# Patient Record
Sex: Female | Born: 1980 | Hispanic: No | Marital: Married | Smoking: Never smoker
Health system: Southern US, Community
[De-identification: ages and names within clinical notes are randomized; demographics above are authoritative.]

## PROBLEM LIST (undated history)

## (undated) DIAGNOSIS — K8000 Calculus of gallbladder with acute cholecystitis without obstruction: Secondary | ICD-10-CM

## (undated) DIAGNOSIS — D691 Qualitative platelet defects: Secondary | ICD-10-CM

## (undated) HISTORY — DX: Calculus of gallbladder with acute cholecystitis without obstruction: K80.00

## (undated) HISTORY — DX: Qualitative platelet defects: D69.1

---

## 2008-07-15 DIAGNOSIS — A539 Syphilis, unspecified: Secondary | ICD-10-CM

## 2008-07-15 HISTORY — DX: Syphilis, unspecified: A53.9

## 2013-06-18 DIAGNOSIS — D691 Qualitative platelet defects: Secondary | ICD-10-CM

## 2017-07-15 HISTORY — PX: CHOLECYSTECTOMY: SHX55

## 2017-12-11 DIAGNOSIS — K8012 Calculus of gallbladder with acute and chronic cholecystitis without obstruction: Secondary | ICD-10-CM | POA: Insufficient documentation

## 2019-07-16 NOTE — L&D Delivery Note (Signed)
Delivery Note At 11:57 PM a viable and healthy female was delivered via Vaginal, Spontaneous (Presentation: Left Occiput Anterior).  APGAR: 8, 9; weight  .   Placenta status: Spontaneous, Intact.  Cord: 3 vessels with the following complications: Mild Shoulder Dystocia  Shoulder dystocia lasted about 45 seconds.  We used McRoberts maneuver, and I was able to grasp the posterior axilla. We rotated the shoulders 180 degrees then she rotated herself back and I was able to use my hand on her back to get shoulder to clear the symphysis.   Cord pH: Pending  Anesthesia: None Episiotomy: None Lacerations: None   I did note there is a button hold flap of tissue on posterior vaginal wall, likely from old delivery.  It did not rupture, left intact.  No lacerations seen Suture Repair: None Est. Blood Loss (mL): 150  Mom to postpartum.  Baby to Couplet care / Skin to Skin.  Claudia Patterson 05/10/2020, 12:26 AM  Please schedule this patient for Postpartum visit in: 4 weeks with the following provider: Any provider EIther virtual or inperson, needs interpretor For C/S patients schedule nurse incision check in weeks 2 weeks: no Low risk pregnancy complicated by: none Delivery mode:  SVD Anticipated Birth Control:  other/unsure PP Procedures needed: none

## 2019-10-13 LAB — OB RESULTS CONSOLE GC/CHLAMYDIA
Chlamydia: NEGATIVE
Gonorrhea: NEGATIVE

## 2019-10-13 LAB — OB RESULTS CONSOLE RPR: RPR: NONREACTIVE

## 2019-10-13 LAB — OB RESULTS CONSOLE HIV ANTIBODY (ROUTINE TESTING): HIV: NONREACTIVE

## 2019-10-13 LAB — CBC WITH DIFFERENTIAL
HCT: 32
HGB: 11.3 (ref 9.5–13.5)
MCH: 31
MCHC: 35
MCV: 87 (ref 76–111)
MPV: 11.1 fL (ref 0–99.8)
Platelet: 171
RBC: 3.68
RDW: 13.3
WBC: 4.8

## 2019-10-13 LAB — HEMOGLOBIN A1C
Est. average glucose Bld gHb Est-mCnc: 111
Hemoglobin A1C: 5.5

## 2019-10-13 LAB — OB RESULTS CONSOLE ABO/RH: RH Type: POSITIVE

## 2019-10-13 LAB — OB RESULTS CONSOLE ANTIBODY SCREEN: Antibody Screen: NEGATIVE

## 2019-10-13 LAB — OB RESULTS CONSOLE RUBELLA ANTIBODY, IGM: Rubella: IMMUNE

## 2019-10-13 LAB — OB RESULTS CONSOLE PLATELET COUNT: Platelets: 171

## 2019-10-13 LAB — OB RESULTS CONSOLE VARICELLA ZOSTER ANTIBODY, IGG: Varicella: NON-IMMUNE/NOT IMMUNE

## 2019-10-13 LAB — OB RESULTS CONSOLE HGB/HCT, BLOOD
HCT: 32 (ref 29–41)
Hemoglobin: 11.3

## 2019-10-13 LAB — HEPATITIS C ANTIBODY: HCV Ab: NEGATIVE

## 2019-10-13 LAB — OB RESULTS CONSOLE HEPATITIS B SURFACE ANTIGEN: Hepatitis B Surface Ag: NEGATIVE

## 2019-10-14 LAB — CHG HEMOGLOBIN FRACTJ/QUANTJ CHROMOTOGRAPHY
HEMOGLOBIN S: NOT DETECTED
Hemoglobin A - HGBRFX: 97.6
Hemoglobin A2.: 2.4
Hemoglobin F - HGBRFX: NOT DETECTED
Hgb C Quant: NOT DETECTED
Variant Hemoglobin - HGBRFX: NOT DETECTED

## 2020-02-01 ENCOUNTER — Other Ambulatory Visit: Payer: Self-pay

## 2020-02-03 ENCOUNTER — Other Ambulatory Visit: Payer: Self-pay

## 2020-02-03 ENCOUNTER — Ambulatory Visit (INDEPENDENT_AMBULATORY_CARE_PROVIDER_SITE_OTHER): Payer: Medicaid Other | Admitting: Primary Care

## 2020-02-03 ENCOUNTER — Encounter (INDEPENDENT_AMBULATORY_CARE_PROVIDER_SITE_OTHER): Payer: Self-pay | Admitting: Primary Care

## 2020-02-03 VITALS — BP 102/69 | HR 73 | Temp 98.2°F | Ht <= 58 in | Wt 105.4 lb

## 2020-02-03 DIAGNOSIS — Z7689 Persons encountering health services in other specified circumstances: Secondary | ICD-10-CM | POA: Diagnosis not present

## 2020-02-03 NOTE — Progress Notes (Signed)
New Patient Office Visit  Subjective:  Patient ID: Claudia Patterson, female    DOB: 05-08-1981  Age: 39 y.o. MRN: [id number]  CC:  Chief Complaint  Patient presents with  . Establish Care    Pt. is here to establish care. She is pregnant.     HPI Ms. Claudia Patterson presents for establishment of care. She is here with her husband and 6 months pregnant . She is schedule with OBGYN next week.  History reviewed. No pertinent past medical history.  Family History  Problem Relation Age of Onset  . Diabetes Neg Hx     Social History   Socioeconomic History  . Marital status: Married    Spouse name: Not on file  . Number of children: Not on file  . Years of education: Not on file  . Highest education level: Not on file  Occupational History  . Not on file  Tobacco Use  . Smoking status: Never Smoker  . Smokeless tobacco: Never Used  Substance and Sexual Activity  . Alcohol use: Not Currently  . Drug use: Not Currently  . Sexual activity: Not Currently  Other Topics Concern  . Not on file  Social History Narrative  . Not on file   Social Determinants of Health   Financial Resource Strain:   . Difficulty of Paying Living Expenses:   Food Insecurity:   . Worried About Programme researcher, broadcasting/film/video in the Last Year:   . Barista in the Last Year:   Transportation Needs:   . Freight forwarder (Medical):   Marland Kitchen Lack of Transportation (Non-Medical):   Physical Activity:   . Days of Exercise per Week:   . Minutes of Exercise per Session:   Stress:   . Feeling of Stress :   Social Connections:   . Frequency of Communication with Friends and Family:   . Frequency of Social Gatherings with Friends and Family:   . Attends Religious Services:   . Active Member of Clubs or Organizations:   . Attends Banker Meetings:   Marland Kitchen Marital Status:   Intimate Partner Violence:   . Fear of Current or Ex-Partner:   . Emotionally Abused:   Marland Kitchen Physically Abused:   . Sexually  Abused:     ROS Review of Systems  All other systems reviewed and are negative.   Objective:   Today's Vitals: BP 102/69 (BP Location: Left Arm, Patient Position: Sitting, Cuff Size: Normal)   Pulse 73   Temp 98.2 F (36.8 C) (Oral)   Ht 4\' 10"  (1.473 m)   Wt 105 lb 6.4 oz (47.8 kg)   SpO2 99%   BMI 22.03 kg/m   Physical Exam Vitals reviewed.  HENT:     Head: Normocephalic.     Right Ear: Tympanic membrane normal.     Left Ear: Tympanic membrane normal.  Eyes:     Extraocular Movements: Extraocular movements intact.     Pupils: Pupils are equal, round, and reactive to light.  Cardiovascular:     Rate and Rhythm: Normal rate and regular rhythm.  Pulmonary:     Effort: Pulmonary effort is normal.     Breath sounds: Normal breath sounds.  Abdominal:     General: There is distension.  Musculoskeletal:        General: Normal range of motion.     Cervical back: Normal range of motion and neck supple.  Skin:    General: Skin is warm and dry.  Neurological:     Mental Status: She is alert and oriented to person, place, and time.  Psychiatric:        Mood and Affect: Mood normal.        Behavior: Behavior normal.        Thought Content: Thought content normal.        Judgment: Judgment normal.     Assessment & Plan:   [name] was seen today for establish care.  Diagnoses and all orders for this visit:  Encounter to establish care Gwinda Passe, NP-C will be your  (PCP) she is mastered prepared . She is skilled to diagnosed and treat illness. Also able to answer health concern as well as continuing care of varied medical conditions, not limited by cause, organ system, or diagnosis.     No outpatient encounter medications on file as of 02/03/2020.   No facility-administered encounter medications on file as of 02/03/2020.    Follow-up: No follow-ups on file.   Grayce Sessions, NP

## 2020-02-07 ENCOUNTER — Encounter: Payer: Self-pay | Admitting: General Practice

## 2020-02-07 ENCOUNTER — Encounter: Payer: Self-pay | Admitting: Obstetrics and Gynecology

## 2020-02-07 ENCOUNTER — Other Ambulatory Visit: Payer: Self-pay | Admitting: *Deleted

## 2020-02-07 DIAGNOSIS — Z3480 Encounter for supervision of other normal pregnancy, unspecified trimester: Secondary | ICD-10-CM | POA: Insufficient documentation

## 2020-02-10 ENCOUNTER — Other Ambulatory Visit: Payer: Self-pay

## 2020-02-10 ENCOUNTER — Encounter: Payer: Self-pay | Admitting: General Practice

## 2020-02-10 ENCOUNTER — Ambulatory Visit (INDEPENDENT_AMBULATORY_CARE_PROVIDER_SITE_OTHER): Payer: Medicaid Other | Admitting: Obstetrics and Gynecology

## 2020-02-10 VITALS — BP 94/55 | HR 90 | Temp 98.9°F | Wt 106.2 lb

## 2020-02-10 DIAGNOSIS — Z789 Other specified health status: Secondary | ICD-10-CM

## 2020-02-10 DIAGNOSIS — Z3480 Encounter for supervision of other normal pregnancy, unspecified trimester: Secondary | ICD-10-CM

## 2020-02-13 ENCOUNTER — Encounter: Payer: Self-pay | Admitting: Obstetrics and Gynecology

## 2020-02-13 NOTE — Progress Notes (Signed)
INITIAL OBSTETRICAL VISIT  Patient name: Claudia Patterson MRN [id number]  Date of birth: 01-19-1981 Chief Complaint:   Initial Prenatal Visit  History of Present Illness:   Claudia Patterson is a 39 y.o. [id number] [place] female at 108w1d by LMP with an Estimated Date of Delivery: 05/13/20 being seen today for her initial obstetrical visit. She is transferring her care from [place], [place]. Her obstetrical history is significant for multigravida. This is a planned pregnancy. She and the father of the baby (FOB)/spouse live together. She has a support system that consists of her husband/family/friends. Today she reports no complaints.   Patient's last menstrual period was 08/07/2019. Last pap 10/18/2019. Results were: normal Review of Systems:   Pertinent items are noted in HPI Denies cramping/contractions, leakage of fluid, vaginal bleeding, abnormal vaginal discharge w/ itching/odor/irritation, headaches, visual changes, shortness of breath, chest pain, abdominal pain, severe nausea/vomiting, or problems with urination or bowel movements unless otherwise stated above.  Pertinent History Reviewed:  Reviewed past medical,surgical, social, obstetrical and family history.  Reviewed problem list, medications and allergies. OB History  Gravida Para Term Preterm AB Living  5 4 4     4   SAB TAB Ectopic Multiple Live Births          4    # Outcome Date GA Lbr Len/2nd Weight Sex Delivery Anes PTL Lv  5 Current           4 Term 06/18/13    M Vag-Spont   LIV     Complications: Thrombocytasthenia (HCC)  3 Term 11/03/04    M Vag-Spont   LIV  2 Term 09/11/02    F Vag-Spont   LIV  1 Term 10/14/00    F Vag-Spont   LIV   Physical Assessment:   Vitals:   02/10/20 1536  BP: (!) 94/55  Pulse: 90  Temp: 98.9 F (37.2 C)  Weight: 106 lb 3.2 oz (48.2 kg)  Body mass index is 22.2 kg/m.       Physical Examination:  General appearance - well appearing, and in no distress  Mental status - alert, oriented to  person, place, and time  Psych:  She has a normal mood and affect  Skin - warm and dry, normal color, no suspicious lesions noted  Chest - effort normal, all lung fields clear to auscultation bilaterally  Heart - normal rate and regular rhythm  Abdomen - soft, nontender  Extremities:  No swelling or varicosities noted  Pelvic - VULVA: normal appearing vulva with no masses, tenderness or lesions  VAGINA: normal appearing vagina with normal color and discharge, no lesions.   CERVIX: normal appearing cervix without discharge or lesions, no CMT  Thin prep pap is not done    FHTs by doppler: 156 bpm  Assessment & Plan:  1) Low-Risk Pregnancy G5P4004 at [redacted]w[redacted]d with an Estimated Date of Delivery: 05/13/20   2) Initial OB visit - Welcomed to practice and introduced self to patient in addition to discussing other advanced practice providers that she may be seeing at this practice - Congratulated patient - Anticipatory guidance on upcoming appointments - Educated on COVID19 and pregnancy and the integration of virtual appointments  - Educated on babyscripts app- patient reports she has not received email, encouraged to look in spam folder and to call office if she still has not received email - patient verbalizes understanding    3) Supervision of other normal pregnancy, antepartum - Review of limited records in Care Everywhere - 05/15/20  MFM OB COMP + 14 WK; Future   4) Language Barrier Affecting Healthcare - Patient speaks and understands some English  Daughter, Claudia Patterson, used for occasional translation  Meds: No orders of the defined types were placed in this encounter.   Initial labs obtained Continue prenatal vitamins Reviewed n/v relief measures and warning s/s to report Reviewed recommended weight gain based on pre-gravid BMI Encouraged well-balanced diet Genetic Screening discussed: could not find in records -- normal per pt Cystic fibrosis, SMA, Fragile X screening discussed could not  find in records -- normal per pt The nature of [facility] with multiple MDs and other Advanced Practice Providers was explained to patient; also emphasized that residents, students are part of our team.  Advised to call during normal business hours and there is an after-hours nurse line available.    Follow-up: Return in about 2 weeks (around 02/24/2020) for Return OB 2hr GTT.   Orders Placed This Encounter  Procedures  . [place] MFM OB COMP + 14 WK  . OB RESULTS CONSOLE GC/Chlamydia  . OB RESULTS CONSOLE RPR  . OB RESULTS CONSOLE HIV antibody  . OB RESULTS CONSOLE Rubella Antibody  . OB RESULTS CONSOLE Varicella zoster antibody, IgG  . OB RESULTS CONSOLE Hepatitis B surface antigen  . OB RESULTS CONSOLE Hemoglobin and hematocrit, blood  . OB RESULTS CONSOLE PLATELET COUNT  . Hepatitis C antibody  . CBC With Differential  . Hemoglobin A1c  . CHG HEMOGLOBIN CHROMOTOGRAPHY  . OB RESULTS CONSOLE ABO/Rh  . OB RESULTS CONSOLE Antibody Screen    Raelyn Mora MSN, CNM 02/10/2020

## 2020-02-14 ENCOUNTER — Other Ambulatory Visit: Payer: Self-pay

## 2020-02-14 ENCOUNTER — Ambulatory Visit: Payer: Medicaid Other | Attending: Obstetrics and Gynecology

## 2020-02-14 ENCOUNTER — Other Ambulatory Visit: Payer: Self-pay | Admitting: Obstetrics and Gynecology

## 2020-02-14 ENCOUNTER — Other Ambulatory Visit: Payer: Self-pay | Admitting: *Deleted

## 2020-02-14 DIAGNOSIS — Z3A37 37 weeks gestation of pregnancy: Secondary | ICD-10-CM | POA: Diagnosis not present

## 2020-02-14 DIAGNOSIS — Z3480 Encounter for supervision of other normal pregnancy, unspecified trimester: Secondary | ICD-10-CM | POA: Diagnosis not present

## 2020-02-14 DIAGNOSIS — O09522 Supervision of elderly multigravida, second trimester: Secondary | ICD-10-CM

## 2020-02-14 DIAGNOSIS — O09523 Supervision of elderly multigravida, third trimester: Secondary | ICD-10-CM

## 2020-02-14 DIAGNOSIS — Z363 Encounter for antenatal screening for malformations: Secondary | ICD-10-CM

## 2020-02-23 ENCOUNTER — Encounter: Payer: Self-pay | Admitting: Obstetrics and Gynecology

## 2020-02-23 ENCOUNTER — Ambulatory Visit (INDEPENDENT_AMBULATORY_CARE_PROVIDER_SITE_OTHER): Payer: Medicaid Other | Admitting: Obstetrics and Gynecology

## 2020-02-23 ENCOUNTER — Other Ambulatory Visit: Payer: Self-pay

## 2020-02-23 VITALS — BP 91/61 | HR 75 | Temp 98.4°F | Wt 106.4 lb

## 2020-02-23 DIAGNOSIS — Z3A28 28 weeks gestation of pregnancy: Secondary | ICD-10-CM

## 2020-02-23 DIAGNOSIS — Z789 Other specified health status: Secondary | ICD-10-CM

## 2020-02-23 DIAGNOSIS — Z3480 Encounter for supervision of other normal pregnancy, unspecified trimester: Secondary | ICD-10-CM

## 2020-02-23 MED ORDER — CITRANATAL HARMONY 27-1-260 MG PO CAPS
1.0000 | ORAL_CAPSULE | Freq: Every day | ORAL | 0 refills | Status: DC
Start: 2020-02-23 — End: 2020-04-21

## 2020-02-23 NOTE — Progress Notes (Signed)
   LOW-RISK PREGNANCY OFFICE VISIT Patient name: Claudia Patterson MRN [id number]  Date of birth: 04-10-1981 Chief Complaint:   Routine Prenatal Visit  History of Present Illness:   Claudia Patterson is a 39 y.o. [id number] female at [redacted]w[redacted]d with an Estimated Date of Delivery: 05/13/20 being seen today for ongoing management of a low-risk pregnancy.  Today she reports she is fasting thinking she was supposed to have 2hr GTT today.Marland Kitchen No pregnancy complaints. Contractions: Not present. Vag. Bleeding: None.  Movement: Present. denies leaking of fluid. Review of Systems:   Pertinent items are noted in HPI Denies abnormal vaginal discharge w/ itching/odor/irritation, headaches, visual changes, shortness of breath, chest pain, abdominal pain, severe nausea/vomiting, or problems with urination or bowel movements unless otherwise stated above. Pertinent History Reviewed:  Reviewed past medical,surgical, social, obstetrical and family history.  Reviewed problem list, medications and allergies. Physical Assessment:   Vitals:   02/23/20 1040  BP: 91/61  Pulse: 75  Temp: 98.4 F (36.9 C)  Weight: 106 lb 6.4 oz (48.3 kg)  Body mass index is 22.24 kg/m.        Physical Examination:   General appearance: Well appearing, and in no distress  Mental status: Alert, oriented to person, place, and time  Skin: Warm & dry  Cardiovascular: Normal heart rate noted  Respiratory: Normal respiratory effort, no distress  Abdomen: Soft, gravid, nontender  Pelvic: Cervical exam deferred         Extremities: Edema: None  Fetal Status: Fetal Heart Rate (bpm): 150 Fundal Height: 28 cm Movement: Present Presentation: Undeterminable  No results found for this or any previous visit (from the past 24 hour(s)).  Assessment & Plan:  1) Low-risk pregnancy G5P4004 at [redacted]w[redacted]d with an Estimated Date of Delivery: 05/13/20   2) Supervision of other normal pregnancy, antepartum  - Rx for Prenat-FeFmCb-DSS-FA-DHA w/o A (CITRANATAL HARMONY)  27-1-260 MG CAPS - Advised that 2 hr GTT appts must be done prior to 0900 - Schedule 2 hr GTT ASAP  3) [redacted] weeks gestation of pregnancy  4) Language barrier affecting health care - Husband, Eddie Candle, present and interpreting for patient   Meds:  Meds ordered this encounter  Medications  . Prenat-FeFmCb-DSS-FA-DHA w/o A (CITRANATAL HARMONY) 27-1-260 MG CAPS    Sig: Take 1 tablet by mouth daily.    Dispense:  30 capsule    Refill:  0    Order Specific Question:   Lot Number?    Answer:   [id number]    Order Specific Question:   Expiration Date?    Answer:   11/11/2020    Order Specific Question:   Quantity    Answer:   30    Comments:   5 tabs/box   Labs/procedures today: none  Plan:  Continue routine obstetrical care   Reviewed: Preterm labor symptoms and general obstetric precautions including but not limited to vaginal bleeding, contractions, leaking of fluid and fetal movement were reviewed in detail with the patient.  All questions were answered.    Follow-up: No follow-ups on file.  No orders of the defined types were placed in this encounter.  Raelyn Mora MSN, CNM 02/23/2020

## 2020-02-28 ENCOUNTER — Encounter: Payer: Self-pay | Admitting: General Practice

## 2020-02-28 ENCOUNTER — Other Ambulatory Visit: Payer: Medicaid Other | Admitting: *Deleted

## 2020-02-28 ENCOUNTER — Other Ambulatory Visit: Payer: Self-pay

## 2020-02-28 DIAGNOSIS — Z3480 Encounter for supervision of other normal pregnancy, unspecified trimester: Secondary | ICD-10-CM

## 2020-02-28 NOTE — Progress Notes (Signed)
    Patient in clinic for 28 week labs.   Reynol Arnone L, RN  

## 2020-02-29 LAB — CBC
Hematocrit: 28.3 % — ABNORMAL LOW (ref 34.0–46.6)
Hemoglobin: 10 g/dL — ABNORMAL LOW (ref 11.1–15.9)
MCH: 33.6 pg — ABNORMAL HIGH (ref 26.6–33.0)
MCHC: 35.3 g/dL (ref 31.5–35.7)
MCV: 95 fL (ref 79–97)
Platelets: 138 10*3/uL — ABNORMAL LOW (ref 150–450)
RBC: 2.98 x10E6/uL — ABNORMAL LOW (ref 3.77–5.28)
RDW: 12.2 % (ref 11.7–15.4)
WBC: 6.3 10*3/uL (ref 3.4–10.8)

## 2020-02-29 LAB — GLUCOSE TOLERANCE, 2 HOURS W/ 1HR
Glucose, 1 hour: 143 mg/dL (ref 65–179)
Glucose, 2 hour: 148 mg/dL (ref 65–152)
Glucose, Fasting: 82 mg/dL (ref 65–91)

## 2020-02-29 LAB — HIV ANTIBODY (ROUTINE TESTING W REFLEX): HIV Screen 4th Generation wRfx: NONREACTIVE

## 2020-02-29 LAB — RPR: RPR Ser Ql: NONREACTIVE

## 2020-03-10 ENCOUNTER — Ambulatory Visit (INDEPENDENT_AMBULATORY_CARE_PROVIDER_SITE_OTHER): Payer: Medicaid Other

## 2020-03-10 VITALS — BP 94/61 | HR 81 | Wt 109.2 lb

## 2020-03-10 DIAGNOSIS — Z3A30 30 weeks gestation of pregnancy: Secondary | ICD-10-CM

## 2020-03-10 DIAGNOSIS — Z3480 Encounter for supervision of other normal pregnancy, unspecified trimester: Secondary | ICD-10-CM

## 2020-03-10 DIAGNOSIS — Z789 Other specified health status: Secondary | ICD-10-CM

## 2020-03-10 NOTE — Progress Notes (Signed)
   LOW-RISK PREGNANCY OFFICE VISIT  Patient name: Claudia Patterson MRN [id number]  Date of birth: 02/09/1981 Chief Complaint:   Routine Prenatal Visit  Subjective:   Claudia Patterson is a 39 y.o. [id number] female at [redacted]w[redacted]d with an Estimated Date of Delivery: 05/13/20 being seen today for ongoing management of a low-risk pregnancy aeb has Supervision of other normal pregnancy, antepartum on their problem list.  Patient presents today with no complaints. Patient endorses fetal movement and denies vaginal concerns including abnormal discharge, leaking of fluid, and bleeding.  Contractions: Not present. Vag. Bleeding: None.  Movement: Present.  Reviewed past medical,surgical, social, obstetrical and family history as well as problem list, medications and allergies.  Objective   Vitals:   03/10/20 1036  BP: 94/61  Pulse: 81  Weight: 109 lb 3.2 oz (49.5 kg)  Body mass index is 22.82 kg/m.  Total Weight Gain:Not found.         Physical Examination:   General appearance: Well appearing, and in no distress  Mental status: Alert, oriented to person, place, and time  Skin: Warm & dry  Cardiovascular: Normal heart rate noted  Respiratory: Normal respiratory effort, no distress  Abdomen: Soft, gravid, nontender, AGA with Fundal height of Fundal Height: 30 cm  Pelvic: Cervical exam deferred           Extremities: Edema: None  Fetal Status: Fetal Heart Rate (bpm): 140  Movement: Present   No results found for this or any previous visit (from the past 24 hour(s)).  Assessment & Plan:  Low-risk pregnancy of a 39 y.o., [id number] at [redacted]w[redacted]d with an Estimated Date of Delivery: 05/13/20   1. Supervision of other normal pregnancy, antepartum -Reviewed labs. -Patient reports taking iron supplement daily. -Given information on iron rich foods. -Anticipatory guidance for upcoming visits.  -Informed that will plan to return to office in 3 weeks. -Given information and address of [facility] for emergent situations.     2. Language barrier affecting health care -[place] interpreter unavailable via [employer] -Husband acting as limited interpreter.  3. [redacted] weeks gestation of pregnancy FH appropriate      Meds: No orders of the defined types were placed in this encounter.  Labs/procedures today:  Lab Orders  No laboratory test(s) ordered today     Reviewed: Preterm labor symptoms and general obstetric precautions including but not limited to vaginal bleeding, contractions, leaking of fluid and fetal movement were reviewed in detail with the patient.  All questions were answered.  Follow-up: Return in about 3 weeks (around 03/31/2020) for LROB.  No orders of the defined types were placed in this encounter.  Cherre Robins MSN, CNM 03/10/2020

## 2020-03-10 NOTE — Patient Instructions (Addendum)
[facility] at [facility] [address] [place],  [place]  [address] Main: [phone]    Iron-Rich Diet  Iron is a mineral that helps your body to produce hemoglobin. Hemoglobin is a protein in red blood cells that carries oxygen to your body's tissues. Eating too little iron may cause you to feel weak and tired, and it can increase your risk of infection. Iron is naturally found in many foods, and many foods have iron added to them (iron-fortified foods). You may need to follow an iron-rich diet if you do not have enough iron in your body due to certain medical conditions. The amount of iron that you need each day depends on your age, your sex, and any medical conditions you have. Follow instructions from your health care provider or a diet and nutrition specialist (dietitian) about how much iron you should eat each day. What are tips for following this plan? Reading food labels  Check food labels to see how many milligrams (mg) of iron are in each serving. Cooking  Cook foods in pots and pans that are made from iron.  Take these steps to make it easier for your body to absorb iron from certain foods: ? Soak beans overnight before cooking. ? Soak whole grains overnight and drain them before using. ? Ferment flours before baking, such as by using yeast in bread dough. Meal planning  When you eat foods that contain iron, you should eat them with foods that are high in vitamin C. These include oranges, peppers, tomatoes, potatoes, and mango. Vitamin C helps your body to absorb iron. General information  Take iron supplements only as told by your health care provider. An overdose of iron can be life-threatening. If you were prescribed iron supplements, take them with orange juice or a vitamin C supplement.  When you eat iron-fortified foods or take an iron supplement, you should also eat foods that naturally contain iron, such as meat,  poultry, and fish. Eating naturally iron-rich foods helps your body to absorb the iron that is added to other foods or contained in a supplement.  Certain foods and drinks prevent your body from absorbing iron properly. Avoid eating these foods in the same meal as iron-rich foods or with iron supplements. These foods include: ? Coffee, black tea, and red wine. ? Milk, dairy products, and foods that are high in calcium. ? Beans and soybeans. ? Whole grains. What foods should I eat? Fruits Prunes. Raisins. Eat fruits high in vitamin C, such as oranges, grapefruits, and strawberries, alongside iron-rich foods. Vegetables Spinach (cooked). Green peas. Broccoli. Fermented vegetables. Eat vegetables high in vitamin C, such as leafy greens, potatoes, bell peppers, and tomatoes, alongside iron-rich foods. Grains Iron-fortified breakfast cereal. Iron-fortified whole-wheat bread. Enriched rice. Sprouted grains. Meats and other proteins Beef liver. Oysters. Beef. Shrimp. [place]. Chicken. Tuna. Sardines. Chickpeas. Nuts. Tofu. Pumpkin seeds. Beverages Tomato juice. Fresh orange juice. Prune juice. Hibiscus tea. Fortified instant breakfast shakes. Sweets and desserts Blackstrap molasses. Seasonings and condiments Tahini. Fermented soy sauce. Other foods Wheat germ. The items listed above may not be a complete list of recommended foods and beverages. Contact a dietitian for more information. What foods should I avoid? Grains Whole grains. Bran cereal. Bran flour. Oats. Meats and other proteins Soybeans. Products made from soy protein. Black beans. Lentils. Mung beans. Split peas. Dairy Milk. Cream. Cheese. Yogurt. Cottage cheese. Beverages Coffee. Black tea. Red wine. Sweets and desserts Cocoa. Chocolate.  Ice cream. Other foods Basil. Oregano. Large amounts of parsley. The items listed above may not be a complete list of foods and beverages to avoid. Contact a dietitian for more  information. Summary  Iron is a mineral that helps your body to produce hemoglobin. Hemoglobin is a protein in red blood cells that carries oxygen to your body's tissues.  Iron is naturally found in many foods, and many foods have iron added to them (iron-fortified foods).  When you eat foods that contain iron, you should eat them with foods that are high in vitamin C. Vitamin C helps your body to absorb iron.  Certain foods and drinks prevent your body from absorbing iron properly, such as whole grains and dairy products. You should avoid eating these foods in the same meal as iron-rich foods or with iron supplements. This information is not intended to replace advice given to you by your health care provider. Make sure you discuss any questions you have with your health care provider. Document Revised: 06/13/2017 Document Reviewed: 05/27/2017 Elsevier Patient Education  2020 [employer].

## 2020-03-14 ENCOUNTER — Ambulatory Visit: Payer: Medicaid Other | Attending: Obstetrics

## 2020-03-14 ENCOUNTER — Other Ambulatory Visit: Payer: Self-pay

## 2020-03-14 DIAGNOSIS — O09523 Supervision of elderly multigravida, third trimester: Secondary | ICD-10-CM | POA: Diagnosis not present

## 2020-03-14 DIAGNOSIS — Z363 Encounter for antenatal screening for malformations: Secondary | ICD-10-CM

## 2020-03-14 DIAGNOSIS — Z3A31 31 weeks gestation of pregnancy: Secondary | ICD-10-CM

## 2020-03-14 DIAGNOSIS — Z362 Encounter for other antenatal screening follow-up: Secondary | ICD-10-CM

## 2020-03-14 IMAGING — US US MFM OB FOLLOW-UP
1 series · 13 of 28 positions shown · non-contrast
Comparison: none

[Series 1: us mfm ob follow-up · 96 acquisitions, 13 frames shown]
[im 4/96]
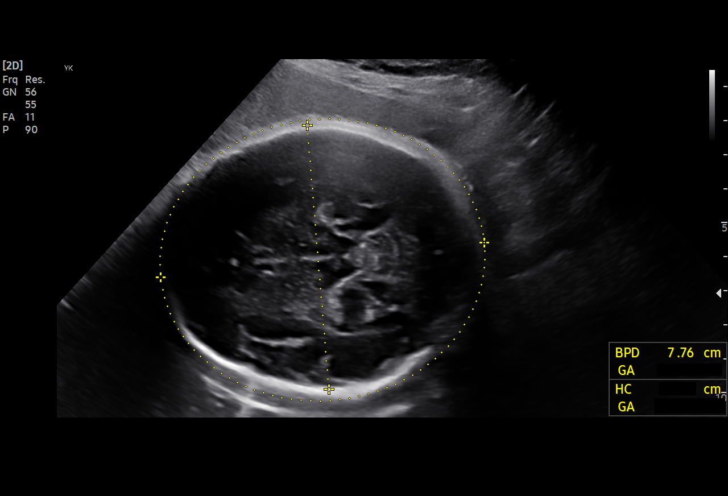
[im 11/96]
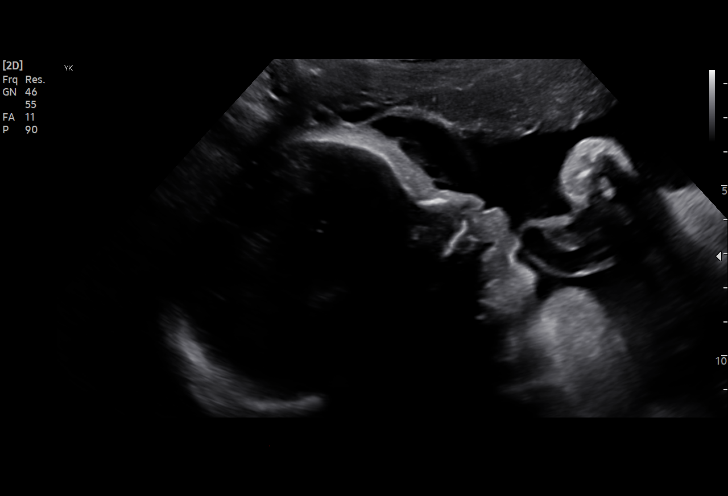
[im 18/96]
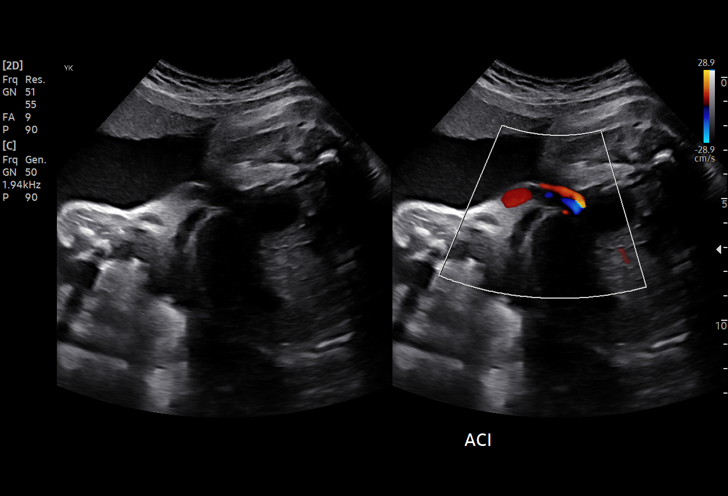
[im 25/96]
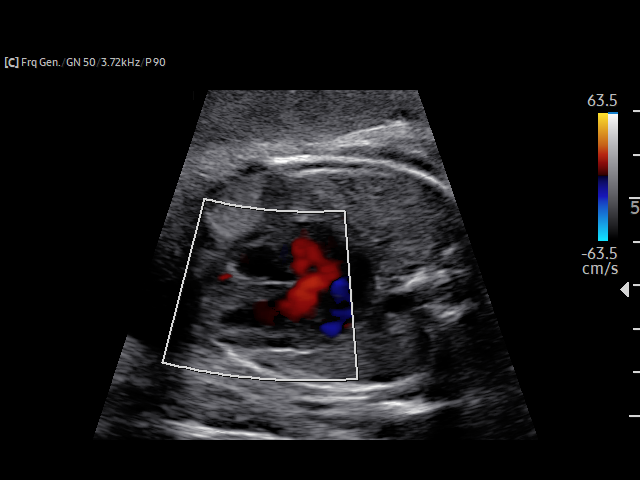
[im 32/96]
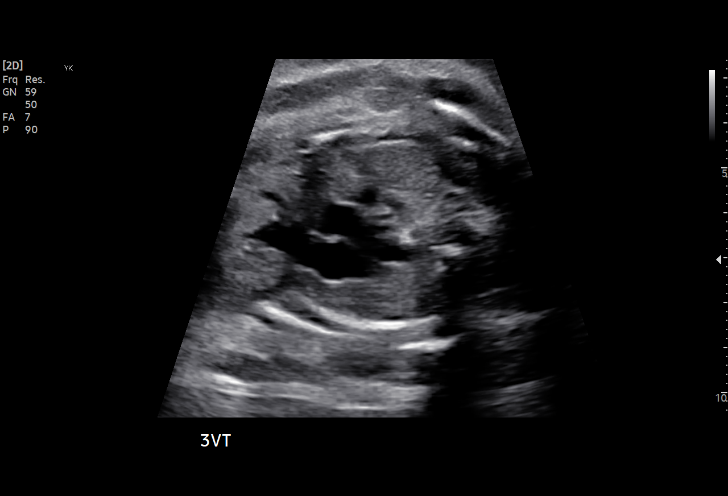
[im 39/96]
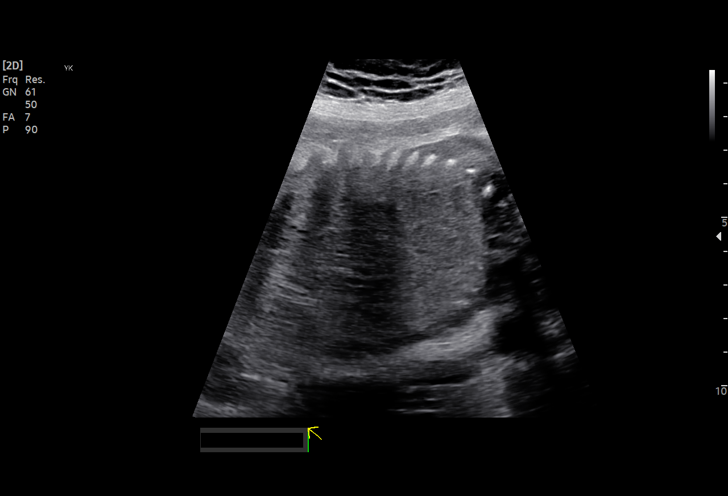
[im 50/96]
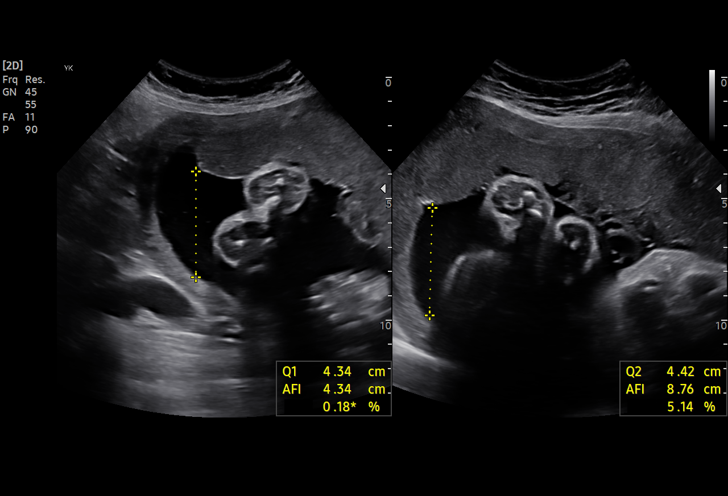
[im 57/96]
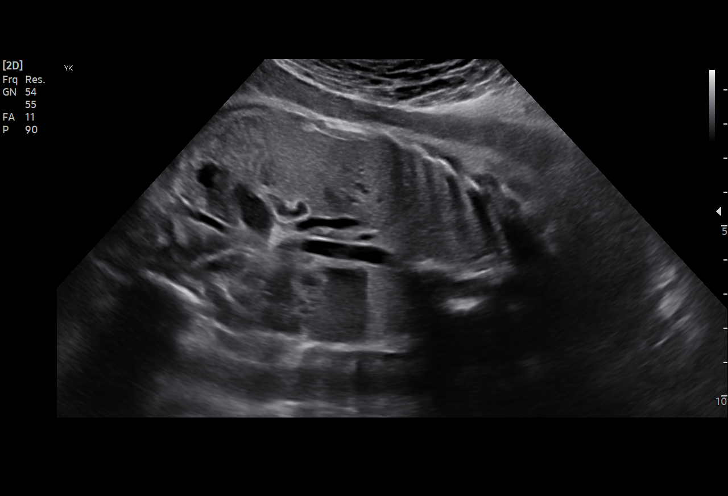
[im 64/96]
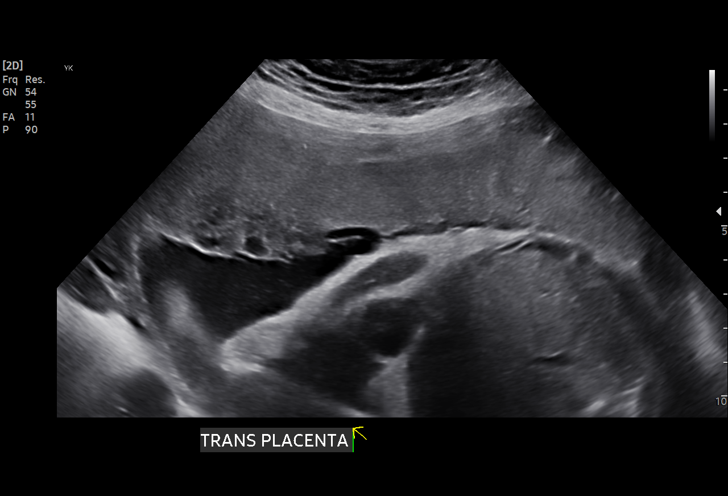
[im 71/96]
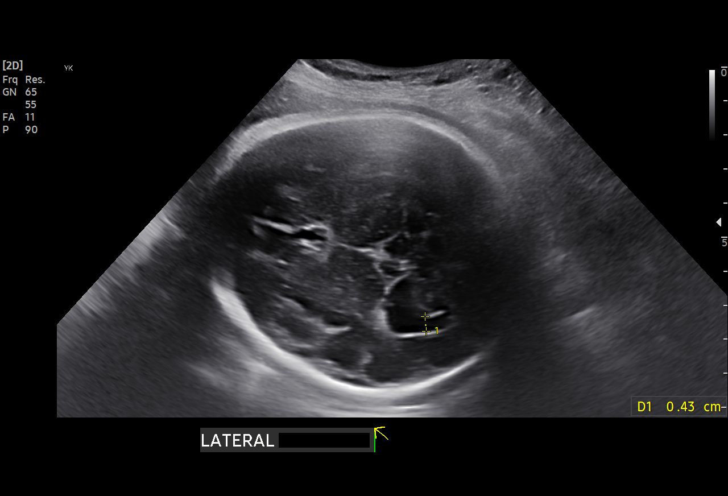
[im 78/96]
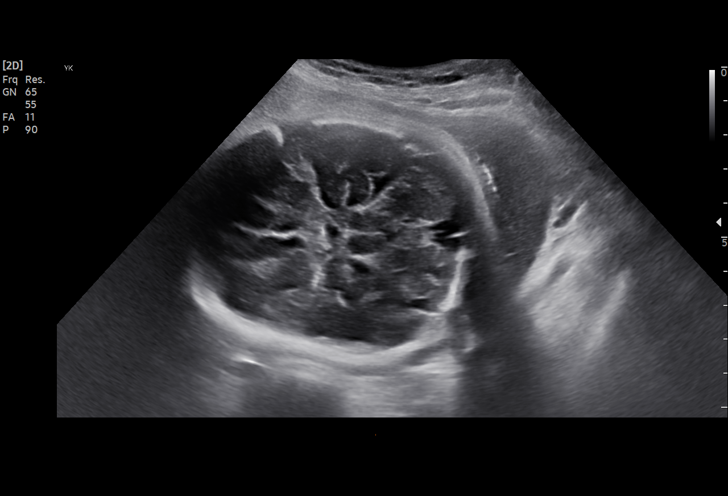
[im 85/96]
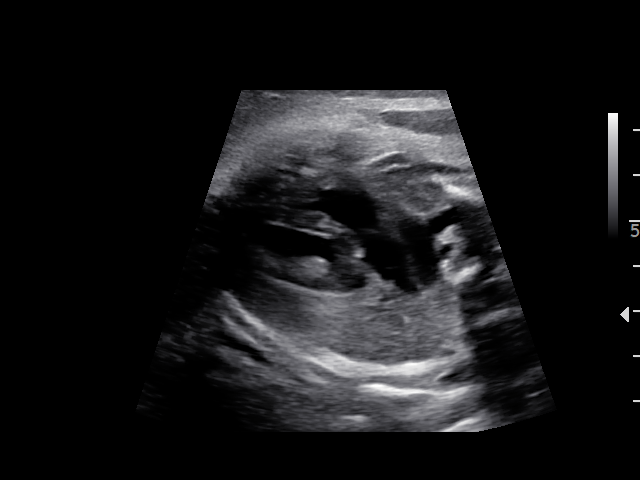
[im 92/96]
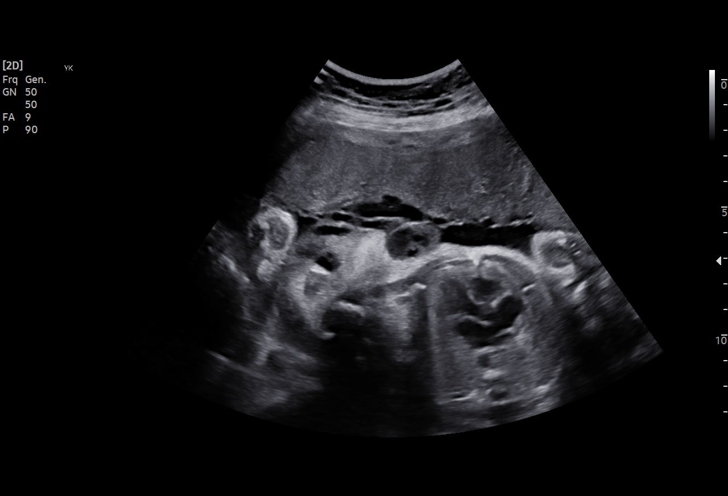

[13 of 28 positions shown; findings below may reference images not displayed]

Indications

 Advanced maternal age multigravida 37+,
 second trimester
 Encounter for antenatal screening for
 malformations
 Antenatal follow-up for nonvisualized fetal
 anatomy
 31 weeks gestation of pregnancy
Fetal Evaluation

 Num Of Fetuses:         1
 Fetal Heart Rate(bpm):  147
 Cardiac Activity:       Observed
 Presentation:           Cephalic
 Placenta:               Anterior
 P. Cord Insertion:      Visualized, central

 Amniotic Fluid
 AFI FV:      Within normal limits

 AFI Sum(cm)     %Tile       Largest Pocket(cm)
 14.25           49

 RUQ(cm)       RLQ(cm)       LUQ(cm)        LLQ(cm)

Biometry
 BPD:      79.1  mm     G. Age:  31w 5d         50  %    CI:        78.15   %    70 - 86
                                                         FL/HC:      20.2   %    19.3 -
 HC:      283.1  mm     G. Age:  31w 0d          9  %    HC/AC:      1.07        0.96 -
 AC:      265.4  mm     G. Age:  30w 5d         25  %    FL/BPD:     72.4   %    71 - 87
 FL:       57.3  mm     G. Age:  30w 0d          8  %    FL/AC:      21.6   %    20 - 24
 HUM:      51.9  mm     G. Age:  30w 2d         28  %

 Est. FW:    2477  gm      3 lb 8 oz     16  %
OB History

 Gravidity:    5         Term:   4
Gestational Age

 LMP:           31w 3d        Date:  08/07/19                 EDD:   05/13/20
 U/S Today:     30w 6d                                        EDD:   05/17/20
 Best:          31w 3d     Det. By:  LMP  (08/07/19)          EDD:   05/13/20
Anatomy

 Cranium:               Appears normal         LVOT:                   Appears normal
 Cavum:                 Appears normal         Aortic Arch:            Appears normal
 Ventricles:            Appears normal         Ductal Arch:            Appears normal
 Choroid Plexus:        Appears normal         Diaphragm:              Appears normal
 Cerebellum:            Appears normal         Stomach:                Appears normal, left
                                                                       sided
 Posterior Fossa:       Appears normal         Abdomen:                Appears normal
 Nuchal Fold:           Not applicable (>20    Abdominal Wall:         Appears nml (cord
                        wks GA)                                        insert, abd wall)
 Face:                  Appears normal         Cord Vessels:           Appears normal (3
                        (orbits and profile)                           vessel cord)
 Lips:                  Appears normal         Kidneys:                Appear normal
 Palate:                Appears normal         Bladder:                Appears normal
 Thoracic:              Appears normal         Spine:                  Previously seen
 Heart:                 Appears normal         Upper Extremities:      Previously seen
                        (4CH, axis, and
                        situs)
 RVOT:                  Appears normal         Lower Extremities:      Previously seen

 Other:  Fetus appears to be female. Technically difficult due to advanced GA
         and fetal position.
Cervix Uterus Adnexa

 Cervix
 Not visualized (advanced GA >74wks)

 Uterus
 No abnormality visualized.

 Cul De Sac
 No free fluid seen.
Comments

 This patient was seen for a follow up exam as the views of
 the fetal anatomy were unable to be fully visualized during
 her last exam.  She denies any problems since her last exam.
 She was informed that the fetal growth and amniotic fluid
 level appears appropriate for her gestational age.
 Although limited due to her advanced gestational age, the
 views of the fetal anatomy were visualized today.  There were
 no obvious anomalies noted.
 The limitations of ultrasound in the detection of all anomalies
 was discussed.
 Follow-up as indicated.

## 2020-03-31 ENCOUNTER — Ambulatory Visit (INDEPENDENT_AMBULATORY_CARE_PROVIDER_SITE_OTHER): Payer: Medicaid Other | Admitting: Obstetrics and Gynecology

## 2020-03-31 ENCOUNTER — Other Ambulatory Visit: Payer: Self-pay

## 2020-03-31 VITALS — BP 104/68 | HR 81 | Temp 97.8°F | Wt 112.0 lb

## 2020-03-31 DIAGNOSIS — Z3A33 33 weeks gestation of pregnancy: Secondary | ICD-10-CM

## 2020-03-31 DIAGNOSIS — Z789 Other specified health status: Secondary | ICD-10-CM

## 2020-03-31 DIAGNOSIS — Z3480 Encounter for supervision of other normal pregnancy, unspecified trimester: Secondary | ICD-10-CM

## 2020-03-31 NOTE — Progress Notes (Signed)
   LOW-RISK PREGNANCY OFFICE VISIT Patient name: Claudia Patterson MRN [id number]  Date of birth: 1980-12-29 Chief Complaint:   Routine Prenatal Visit  History of Present Illness:   Claudia Patterson is a 39 y.o. [id number] female at [redacted]w[redacted]d with an Estimated Date of Delivery: 05/13/20 being seen today for ongoing management of a low-risk pregnancy.  Today she reports no complaints. Contractions: Not present. Vag. Bleeding: None.  Movement: Present. denies leaking of fluid. Review of Systems:   Pertinent items are noted in HPI Denies abnormal vaginal discharge w/ itching/odor/irritation, headaches, visual changes, shortness of breath, chest pain, abdominal pain, severe nausea/vomiting, or problems with urination or bowel movements unless otherwise stated above. Pertinent History Reviewed:  Reviewed past medical,surgical, social, obstetrical and family history.  Reviewed problem list, medications and allergies. Physical Assessment:   Vitals:   03/31/20 1045  BP: 104/68  Pulse: 81  Temp: 97.8 F (36.6 C)  Weight: 112 lb (50.8 kg)  Body mass index is 23.41 kg/m.        Physical Examination:   General appearance: Well appearing, and in no distress  Mental status: Alert, oriented to person, place, and time  Skin: Warm & dry  Cardiovascular: Normal heart rate noted  Respiratory: Normal respiratory effort, no distress  Abdomen: Soft, gravid, nontender  Pelvic: Cervical exam deferred         Extremities: Edema: None  Fetal Status: Fetal Heart Rate (bpm): 130   Movement: Present    No results found for this or any previous visit (from the past 24 hour(s)).  Assessment & Plan:  1) Low-risk pregnancy G5P4004 at [redacted]w[redacted]d with an Estimated Date of Delivery: 05/13/20   2) Supervision of other normal pregnancy, antepartum - Anticipatory guidance for GBS and cervical exam at next visit  3) Language barrier affecting health care - [employer] Interpreter ID #[id number]  4) [redacted] weeks gestation of pregnancy      Meds: No orders of the defined types were placed in this encounter.  Labs/procedures today: none  Plan:  Continue routine obstetrical care   Reviewed: Preterm labor symptoms and general obstetric precautions including but not limited to vaginal bleeding, contractions, leaking of fluid and fetal movement were reviewed in detail with the patient.  All questions were answered.   Follow-up: Return in about 3 weeks (around 04/21/2020) for Return OB w/GBS.  No orders of the defined types were placed in this encounter.  Raelyn Mora MSN, CNM 03/31/2020 11:14 AM

## 2020-04-21 ENCOUNTER — Ambulatory Visit (INDEPENDENT_AMBULATORY_CARE_PROVIDER_SITE_OTHER): Payer: Medicaid Other | Admitting: Obstetrics & Gynecology

## 2020-04-21 ENCOUNTER — Other Ambulatory Visit: Payer: Self-pay

## 2020-04-21 ENCOUNTER — Other Ambulatory Visit (HOSPITAL_COMMUNITY)
Admission: RE | Admit: 2020-04-21 | Discharge: 2020-04-21 | Disposition: A | Discharge status: Home / Self Care | Payer: Medicaid Other | Source: Ambulatory Visit | Attending: Obstetrics & Gynecology | Admitting: Obstetrics & Gynecology

## 2020-04-21 VITALS — BP 98/65 | HR 82 | Temp 98.0°F | Wt 113.6 lb

## 2020-04-21 DIAGNOSIS — Z3483 Encounter for supervision of other normal pregnancy, third trimester: Secondary | ICD-10-CM | POA: Insufficient documentation

## 2020-04-21 DIAGNOSIS — Z3A36 36 weeks gestation of pregnancy: Secondary | ICD-10-CM

## 2020-04-21 DIAGNOSIS — Z3480 Encounter for supervision of other normal pregnancy, unspecified trimester: Secondary | ICD-10-CM

## 2020-04-21 MED ORDER — CITRANATAL 90 DHA 90-1 & 300 MG PO MISC: 1.0000 | Freq: Every day | ORAL | 0 refills | Status: DC

## 2020-04-21 NOTE — Progress Notes (Signed)
   PRENATAL VISIT NOTE  Subjective:  Claudia Patterson is a 39 y.o. [id number] at [redacted]w[redacted]d being seen today for ongoing prenatal care. [employer] interpreter used during this encounter.  She is currently monitored for the following issues for this low-risk pregnancy and has Supervision of other normal pregnancy, antepartum on their problem list.  Patient reports no complaints.  Contractions: Not present. Vag. Bleeding: None.  Movement: Present. Denies leaking of fluid.   The following portions of the patient's history were reviewed and updated as appropriate: allergies, current medications, past family history, past medical history, past social history, past surgical history and problem list.   Objective:   Vitals:   04/21/20 1106  BP: 98/65  Pulse: 82  Temp: 98 F (36.7 C)  Weight: 113 lb 9.6 oz (51.5 kg)    Fetal Status: Fetal Heart Rate (bpm): 137 Fundal Height: 37 cm Movement: Present  Presentation: Vertex  General:  Alert, oriented and cooperative. Patient is in no acute distress.  Skin: Skin is warm and dry. No rash noted.   Cardiovascular: Normal heart rate noted  Respiratory: Normal respiratory effort, no problems with respiration noted  Abdomen: Soft, gravid, appropriate for gestational age.  Pain/Pressure: Absent     Pelvic: Cervical exam deferred, patient declined        Extremities: Normal range of motion.  Edema: None  Mental Status: Normal mood and affect. Normal behavior. Normal judgment and thought content.   Assessment and Plan:  Pregnancy: G5P4004 at [redacted]w[redacted]d 1. [redacted] weeks gestation of pregnancy 2. Supervision of other normal pregnancy, antepartum Pelvic cultures done.  Cephalic presentation verified on bedside scan, patient declined exam. - Strep Gp B NAA - GC/Chlamydia probe amp ([place])not at [facility] Prenatal vitamin samples given to patient. Preterm labor symptoms and general obstetric precautions including but not limited to vaginal bleeding, contractions, leaking of  fluid and fetal movement were reviewed in detail with the patient. Please refer to After Visit Summary for other counseling recommendations.   Return in about 1 week (around 04/28/2020) for OFFICE OB Visit.  Future Appointments  Date Time Provider Department Center  04/28/2020  9:30 AM Gerrit Heck, CNM [facility] None  05/05/2020  9:50 AM Gerrit Heck, CNM [facility] None  05/12/2020  9:10 AM Bernerd Limbo, CNM [facility] None    Jaynie Collins, MD

## 2020-04-21 NOTE — Patient Instructions (Signed)
Return to office for any scheduled appointments. Call the office or go to the MAU at [facility] [place] if:  You begin to have strong, frequent contractions  Your water breaks.  Sometimes it is a big gush of fluid, sometimes it is just a trickle that keeps getting your panties wet or running down your legs  You have vaginal bleeding.  It is normal to have a small amount of spotting if your cervix was checked.   You do not feel your baby moving like normal.  If you do not, get something to eat and drink and lay down and focus on feeling your baby move.   If your baby is still not moving like normal, you should call the office or go to MAU.  Any other obstetric concerns.   

## 2020-04-23 LAB — GC/CHLAMYDIA PROBE AMP (~~LOC~~) NOT AT ARMC
Chlamydia: NEGATIVE
Comment: NEGATIVE
Comment: NORMAL
Neisseria Gonorrhea: NEGATIVE

## 2020-04-23 LAB — STREP GP B NAA: Strep Gp B NAA: NEGATIVE

## 2020-04-28 ENCOUNTER — Other Ambulatory Visit: Payer: Self-pay

## 2020-04-28 ENCOUNTER — Ambulatory Visit (INDEPENDENT_AMBULATORY_CARE_PROVIDER_SITE_OTHER): Payer: Medicaid Other

## 2020-04-28 VITALS — BP 123/84 | HR 75 | Temp 98.3°F | Wt 115.6 lb

## 2020-04-28 DIAGNOSIS — Z3480 Encounter for supervision of other normal pregnancy, unspecified trimester: Secondary | ICD-10-CM

## 2020-04-28 DIAGNOSIS — Z3A37 37 weeks gestation of pregnancy: Secondary | ICD-10-CM

## 2020-04-28 DIAGNOSIS — Z789 Other specified health status: Secondary | ICD-10-CM

## 2020-04-28 NOTE — Patient Instructions (Signed)
Postpartum Tubal Ligation Postpartum tubal ligation (PPTL) is a procedure to close the fallopian tubes. This is done so that you cannot get pregnant. When the fallopian tubes are closed, the eggs that the ovaries release cannot enter the uterus, and sperm cannot reach the eggs. PPTL is done right after childbirth or 1-2 days after childbirth, before the uterus returns to its normal location. If you have a cesarean section, it can be performed at the same time as the procedure. Having this done after childbirth does not make your stay in the hospital longer. PPTL is sometimes called "getting your tubes tied." You should not have this procedure if you want to get pregnant again or if you are unsure about having more children. Tell a health care provider about:  Any allergies you have.  All medicines you are taking, including vitamins, herbs, eye drops, creams, and over-the-counter medicines.  Any problems you or family members have had with anesthetic medicines.  Any blood disorders you have.  Any surgeries you have had.  Any medical conditions you have or have had.  Any past pregnancies. What are the risks? Generally, this is a safe procedure. However, problems may occur, including:  Infection.  Bleeding.  Injury to other organs in the abdomen.  Side effects from anesthetic medicines.  Failure of the procedure. If this happens, you could get pregnant.  Having a fertilized egg attach outside the uterus (ectopic pregnancy). What happens before the procedure?  Ask your health care provider about: ? How much pain you can expect to have. ? What medicines you will be given for pain, especially if you are planning to breastfeed. What happens during the procedure? If you had a vaginal delivery:  You will be given one or more of the following: ? A medicine to help you relax (sedative). ? A medicine to numb the area (local anesthetic). ? A medicine to make you fall asleep (general  anesthetic). ? A medicine that is injected into an area of your body to numb everything below the injection site (regional anesthetic).  If you have been given a general anesthetic, a tube will be put down your throat to help you breathe.  An IV will be inserted into one of your veins.  Your bladder may be emptied with a small tube (catheter).  An incision will be made just below your belly button.  Your fallopian tubes will be located and brought up through the incision.  Your fallopian tubes will be tied off, burned (cauterized), or blocked with a clip, ring, or clamp. A small part in the center of each fallopian tube may be removed.  The incision will be closed with stitches (sutures).  A bandage (dressing) will be placed over the incision. If you had a cesarean delivery:  Tubal ligation will be done through the incision that was used for the cesarean delivery of your baby.  The incision will be closed with sutures.  A dressing will be placed over the incision. The procedure may vary among health care providers and hospitals. What happens after the procedure?  Your blood pressure, heart rate, breathing rate, and blood oxygen level will be monitored until you leave the hospital.  You will be given pain medicine as needed.  Do not drive for 24 hours if you were given a sedative during your procedure. Summary  Postpartum tubal ligation is a procedure that closes the fallopian tubes so you cannot get pregnant anymore.  This procedure is done while you are still   in the hospital after childbirth. If you have a cesarean section, it can be performed at the same time.  Having this done after childbirth does not make your stay in the hospital longer.  Postpartum tubal ligation is considered permanent. You should not have this procedure if you want to get pregnant again or if you are unsure about having more children.  Talk to your health care provider to see if this procedure is  right for you. This information is not intended to replace advice given to you by your health care provider. Make sure you discuss any questions you have with your health care provider. Document Revised: 12/14/2018 Document Reviewed: 05/21/2018 Elsevier Patient Education  2020 Elsevier Inc. Contraception Choices Contraception, also called birth control, refers to methods or devices that prevent pregnancy. Hormonal methods Contraceptive implant  A contraceptive implant is a thin, plastic tube that contains a hormone. It is inserted into the upper part of the arm. It can remain in place for up to 3 years. Progestin-only injections Progestin-only injections are injections of progestin, a synthetic form of the hormone progesterone. They are given every 3 months by a health care provider. Birth control pills  Birth control pills are pills that contain hormones that prevent pregnancy. They must be taken once a day, preferably at the same time each day. Birth control patch  The birth control patch contains hormones that prevent pregnancy. It is placed on the skin and must be changed once a week for three weeks and removed on the fourth week. A prescription is needed to use this method of contraception. Vaginal ring  A vaginal ring contains hormones that prevent pregnancy. It is placed in the vagina for three weeks and removed on the fourth week. After that, the process is repeated with a new ring. A prescription is needed to use this method of contraception. Emergency contraceptive Emergency contraceptives prevent pregnancy after unprotected sex. They come in pill form and can be taken up to 5 days after sex. They work best the sooner they are taken after having sex. Most emergency contraceptives are available without a prescription. This method should not be used as your only form of birth control. Barrier methods Female condom  A female condom is a thin sheath that is worn over the penis during  sex. Condoms keep sperm from going inside a woman's body. They can be used with a spermicide to increase their effectiveness. They should be disposed after a single use. Female condom  A female condom is a soft, loose-fitting sheath that is put into the vagina before sex. The condom keeps sperm from going inside a woman's body. They should be disposed after a single use. Diaphragm  A diaphragm is a soft, dome-shaped barrier. It is inserted into the vagina before sex, along with a spermicide. The diaphragm blocks sperm from entering the uterus, and the spermicide kills sperm. A diaphragm should be left in the vagina for 6-8 hours after sex and removed within 24 hours. A diaphragm is prescribed and fitted by a health care provider. A diaphragm should be replaced every 1-2 years, after giving birth, after gaining more than 15 lb (6.8 kg), and after pelvic surgery. Cervical cap  A cervical cap is a round, soft latex or plastic cup that fits over the cervix. It is inserted into the vagina before sex, along with spermicide. It blocks sperm from entering the uterus. The cap should be left in place for 6-8 hours after sex and removed within   48 hours. A cervical cap must be prescribed and fitted by a health care provider. It should be replaced every 2 years. Sponge  A sponge is a soft, circular piece of polyurethane foam with spermicide on it. The sponge helps block sperm from entering the uterus, and the spermicide kills sperm. To use it, you make it wet and then insert it into the vagina. It should be inserted before sex, left in for at least 6 hours after sex, and removed and thrown away within 30 hours. Spermicides Spermicides are chemicals that kill or block sperm from entering the cervix and uterus. They can come as a cream, jelly, suppository, foam, or tablet. A spermicide should be inserted into the vagina with an applicator at least 10-15 minutes before sex to allow time for it to work. The process  must be repeated every time you have sex. Spermicides do not require a prescription. Intrauterine contraception Intrauterine device (IUD) An IUD is a T-shaped device that is put in a woman's uterus. There are two types:  Hormone IUD.This type contains progestin, a synthetic form of the hormone progesterone. This type can stay in place for 3-5 years.  Copper IUD.This type is wrapped in copper wire. It can stay in place for 10 years.  Permanent methods of contraception Female tubal ligation In this method, a woman's fallopian tubes are sealed, tied, or blocked during surgery to prevent eggs from traveling to the uterus. Hysteroscopic sterilization In this method, a small, flexible insert is placed into each fallopian tube. The inserts cause scar tissue to form in the fallopian tubes and block them, so sperm cannot reach an egg. The procedure takes about 3 months to be effective. Another form of birth control must be used during those 3 months. Female sterilization This is a procedure to tie off the tubes that carry sperm (vasectomy). After the procedure, the man can still ejaculate fluid (semen). Natural planning methods Natural family planning In this method, a couple does not have sex on days when the woman could become pregnant. Calendar method This means keeping track of the length of each menstrual cycle, identifying the days when pregnancy can happen, and not having sex on those days. Ovulation method In this method, a couple avoids sex during ovulation. Symptothermal method This method involves not having sex during ovulation. The woman typically checks for ovulation by watching changes in her temperature and in the consistency of cervical mucus. Post-ovulation method In this method, a couple waits to have sex until after ovulation. Summary  Contraception, also called birth control, means methods or devices that prevent pregnancy.  Hormonal methods of contraception include implants,  injections, pills, patches, vaginal rings, and emergency contraceptives.  Barrier methods of contraception can include female condoms, female condoms, diaphragms, cervical caps, sponges, and spermicides.  There are two types of IUDs (intrauterine devices). An IUD can be put in a woman's uterus to prevent pregnancy for 3-5 years.  Permanent sterilization can be done through a procedure for males, females, or both.  Natural family planning methods involve not having sex on days when the woman could become pregnant. This information is not intended to replace advice given to you by your health care provider. Make sure you discuss any questions you have with your health care provider. Document Revised: 07/03/2017 Document Reviewed: 08/03/2016 Elsevier Patient Education  2020 Elsevier Inc.  

## 2020-04-28 NOTE — Progress Notes (Signed)
   LOW-RISK PREGNANCY OFFICE VISIT  Patient name: Claudia Patterson MRN [id number]  Date of birth: 1981-07-02 Chief Complaint:   Routine Prenatal Visit  Subjective:   Claudia Patterson is a 39 y.o. [id number] female at [redacted]w[redacted]d with an Estimated Date of Delivery: 05/13/20 being seen today for ongoing management of a low-risk pregnancy aeb has Supervision of other normal pregnancy, antepartum on their problem list.  Patient presents today without complaints, questions, or concerns. Patient endorses fetal movement and denies abdominal cramping or contractions. Patient also  denies vaginal concerns including abnormal discharge, leaking of fluid, and bleeding.  Contractions: Not present. Vag. Bleeding: None.  Movement: Present.  Reviewed past medical,surgical, social, obstetrical and family history as well as problem list, medications and allergies.  Objective   Vitals:   04/28/20 0933  BP: 123/84  Pulse: 75  Temp: 98.3 F (36.8 C)  Weight: 115 lb 9.6 oz (52.4 kg)  Body mass index is 24.16 kg/m.  Total Weight Gain:Not found.         Physical Examination:   General appearance: Well appearing, and in no distress  Mental status: Alert, oriented to person, place, and time  Skin: Warm & dry  Cardiovascular: Normal heart rate noted  Respiratory: Normal respiratory effort, no distress  Abdomen: Soft, gravid, nontender, AGA with Fundal height of Fundal Height: 37 cm  Pelvic: Cervical exam deferred           Extremities: Edema: None  Fetal Status: Fetal Heart Rate (bpm): 153  Movement: Present   No results found for this or any previous visit (from the past 24 hour(s)).  Assessment & Plan:  Low-risk pregnancy of a 39 y.o., [id number] at [redacted]w[redacted]d with an Estimated Date of Delivery: 05/13/20   1. Supervision of other normal pregnancy, antepartum -Reviewed recent labs. -Discussed postpartum contraception including PPIUD and BTL. -Given information and encouraged to read over and inform staff of desire.   2.  Language barrier affecting health care -[employer] interpreter utilized- ID# [email or web address] Agostino  3. [redacted] weeks gestation of pregnancy -Doing well -No concerns or complaints.      Meds: No orders of the defined types were placed in this encounter.  Labs/procedures today:  Lab Orders  No laboratory test(s) ordered today     Reviewed: Term labor symptoms and general obstetric precautions including but not limited to vaginal bleeding, contractions, leaking of fluid and fetal movement were reviewed in detail with the patient.  All questions were answered.  Follow-up: Return in about 1 week (around 05/05/2020) for LROB.  No orders of the defined types were placed in this encounter.  Cherre Robins MSN, CNM 04/28/2020

## 2020-05-05 ENCOUNTER — Other Ambulatory Visit: Payer: Self-pay

## 2020-05-05 ENCOUNTER — Ambulatory Visit (INDEPENDENT_AMBULATORY_CARE_PROVIDER_SITE_OTHER): Payer: Medicaid Other

## 2020-05-05 VITALS — BP 115/71 | HR 73 | Temp 97.9°F | Wt 115.0 lb

## 2020-05-05 DIAGNOSIS — Z3A38 38 weeks gestation of pregnancy: Secondary | ICD-10-CM

## 2020-05-05 DIAGNOSIS — Z3480 Encounter for supervision of other normal pregnancy, unspecified trimester: Secondary | ICD-10-CM

## 2020-05-05 DIAGNOSIS — Z789 Other specified health status: Secondary | ICD-10-CM

## 2020-05-05 NOTE — Progress Notes (Signed)
   LOW-RISK PREGNANCY OFFICE VISIT  Patient name: Claudia Patterson MRN [id number]  Date of birth: [birth year] Chief Complaint:   Routine Prenatal Visit  Subjective:   Claudia Patterson is a 39 y.o. [id number] female at [redacted]w[redacted]d with an Estimated Date of Delivery: 05/13/20 being seen today for ongoing management of a low-risk pregnancy aeb has Supervision of other normal pregnancy, antepartum on their problem list.  Patient presents today without complaints. Patient endorses fetal movement and denies vaginal concerns including abnormal discharge, leaking of fluid, and bleeding. She also denies abdominal cramping or contractions.   .  .  Movement: Present.  Reviewed past medical,surgical, social, obstetrical and family history as well as problem list, medications and allergies.  Objective   Vitals:   05/05/20 1015  BP: 115/71  Pulse: 73  Temp: 97.9 F (36.6 C)  Weight: 115 lb (52.2 kg)  Body mass index is 24.04 kg/m.  Total Weight Gain:Not found.         Physical Examination:   General appearance: Well appearing, and in no distress  Mental status: Alert, oriented to person, place, and time  Skin: Warm & dry  Cardiovascular: Normal heart rate noted  Respiratory: Normal respiratory effort, no distress  Abdomen: Soft, gravid, nontender, AGA with Fundal height of Fundal Height: 39 cm  Pelvic: Cervical exam deferred           Extremities: Edema: None  Fetal Status:    Movement: Present   No results found for this or any previous visit (from the past 24 hour(s)).  Assessment & Plan:  Low-risk pregnancy of a 39 y.o., [id number] at [redacted]w[redacted]d with an Estimated Date of Delivery: 05/13/20   1. Supervision of other normal pregnancy, antepartum -Reviewed exam findings and GBS results. -Discussed PP preparations.  -Not interested in birth control method at this time. -Reviewed IOL and requests for scheduling on Nov 1 at 0700 sent.  2. [redacted] weeks gestation of pregnancy -Doing well. -Anticipatory guidance for  upcoming appts.   3. Language barrier affecting health care -[employer] Interpreter used-Augustina #KAAM    Meds: No orders of the defined types were placed in this encounter.  Labs/procedures today:  Lab Orders  No laboratory test(s) ordered today     Reviewed: Term labor symptoms and general obstetric precautions including but not limited to vaginal bleeding, contractions, leaking of fluid and fetal movement were reviewed in detail with the patient.  All questions were answered.  Follow-up: Return in about 1 week (around 05/12/2020) for LROB.  No orders of the defined types were placed in this encounter.  Cherre Robins MSN, CNM 05/05/2020

## 2020-05-05 NOTE — Patient Instructions (Signed)

## 2020-05-05 NOTE — Progress Notes (Signed)
   Induction Assessment Scheduling Form: Fax to [facility]:  [phone]  Jamayah Myszka                                                                                   DOB:  [birth year]                                                            MRN:  [id number]                                                                     Phone #:   [phone]                         Provider:  [facility]  GP:  [id number]                                                            Estimated Date of Delivery: 05/13/20  Dating Criteria: LMP    Medical Indications for induction:  PostDate Admission Date/Time:  November 1st at 0700 Gestational age on admission:  39 2/7   Filed Weights   05/05/20 1015  Weight: 115 lb (52.2 kg)   HIV:  Non Reactive (08/16 0830) GBS: Negative/-- (10/08 1124)  Bishop score TBD   Method of induction(proposed):  TBD   Scheduling Provider Signature:  Cherre Robins, CNM                                            Today's Date:  05/05/2020

## 2020-05-09 ENCOUNTER — Other Ambulatory Visit: Payer: Self-pay

## 2020-05-09 ENCOUNTER — Encounter (HOSPITAL_COMMUNITY): Payer: Self-pay | Admitting: Obstetrics & Gynecology

## 2020-05-09 ENCOUNTER — Inpatient Hospital Stay (HOSPITAL_COMMUNITY)
Admission: AD | Admit: 2020-05-09 | Discharge: 2020-05-11 | DRG: 807 | Disposition: A | Discharge status: Home / Self Care | Payer: Medicaid Other | Attending: Obstetrics & Gynecology | Admitting: Obstetrics & Gynecology

## 2020-05-09 DIAGNOSIS — Z20822 Contact with and (suspected) exposure to covid-19: Secondary | ICD-10-CM | POA: Diagnosis present

## 2020-05-09 DIAGNOSIS — O480 Post-term pregnancy: Principal | ICD-10-CM | POA: Diagnosis present

## 2020-05-09 DIAGNOSIS — Z603 Acculturation difficulty: Secondary | ICD-10-CM

## 2020-05-09 DIAGNOSIS — Z3A39 39 weeks gestation of pregnancy: Secondary | ICD-10-CM | POA: Diagnosis not present

## 2020-05-09 DIAGNOSIS — O26893 Other specified pregnancy related conditions, third trimester: Secondary | ICD-10-CM | POA: Diagnosis present

## 2020-05-09 DIAGNOSIS — Z3480 Encounter for supervision of other normal pregnancy, unspecified trimester: Principal | ICD-10-CM

## 2020-05-09 LAB — RESPIRATORY PANEL BY RT PCR (FLU A&B, COVID)
Influenza A by PCR: NEGATIVE
Influenza B by PCR: NEGATIVE
SARS Coronavirus 2 by RT PCR: NEGATIVE

## 2020-05-09 LAB — CBC
HCT: 35.5 % — ABNORMAL LOW (ref 36.0–46.0)
Hemoglobin: 11.9 g/dL — ABNORMAL LOW (ref 12.0–15.0)
MCH: 31.1 pg (ref 26.0–34.0)
MCHC: 33.5 g/dL (ref 30.0–36.0)
MCV: 92.7 fL (ref 80.0–100.0)
Platelets: 135 10*3/uL — ABNORMAL LOW (ref 150–400)
RBC: 3.83 MIL/uL — ABNORMAL LOW (ref 3.87–5.11)
RDW: 13.2 % (ref 11.5–15.5)
WBC: 7.7 10*3/uL (ref 4.0–10.5)
nRBC: 0 % (ref 0.0–0.2)

## 2020-05-09 LAB — TYPE AND SCREEN
ABO/RH(D): O POS
Antibody Screen: NEGATIVE

## 2020-05-09 MED ORDER — SOD CITRATE-CITRIC ACID 500-334 MG/5ML PO SOLN: 30.0000 mL | ORAL | Status: DC | PRN

## 2020-05-09 MED ORDER — LIDOCAINE HCL (PF) 1 % IJ SOLN: 30.0000 mL | INTRAMUSCULAR | Status: DC | PRN

## 2020-05-09 MED ORDER — LACTATED RINGERS IV SOLN: INTRAVENOUS | Status: DC

## 2020-05-09 MED ORDER — ONDANSETRON HCL 4 MG/2ML IJ SOLN: 4.0000 mg | Freq: Four times a day (QID) | INTRAMUSCULAR | Status: DC | PRN

## 2020-05-09 MED ORDER — MISOPROSTOL 25 MCG QUARTER TABLET: 25.0000 ug | ORAL_TABLET | ORAL | Status: DC | PRN

## 2020-05-09 MED ORDER — OXYTOCIN BOLUS FROM INFUSION
333.0000 mL | Freq: Once | INTRAVENOUS | Status: AC
  Administered 2020-05-10: 333 mL via INTRAVENOUS

## 2020-05-09 MED ORDER — ACETAMINOPHEN 325 MG PO TABS: 650.0000 mg | ORAL_TABLET | ORAL | Status: DC | PRN

## 2020-05-09 MED ORDER — LACTATED RINGERS IV SOLN: 500.0000 mL | INTRAVENOUS | Status: DC | PRN

## 2020-05-09 MED ORDER — OXYTOCIN-SODIUM CHLORIDE 30-0.9 UT/500ML-% IV SOLN
2.5000 [IU]/h | INTRAVENOUS | Status: DC
  Administered 2020-05-10: 2.5 [IU]/h via INTRAVENOUS
  Filled 2020-05-09: qty 500

## 2020-05-09 MED ORDER — TERBUTALINE SULFATE 1 MG/ML IJ SOLN: 0.2500 mg | Freq: Once | INTRAMUSCULAR | Status: DC | PRN

## 2020-05-09 MED ORDER — FENTANYL CITRATE (PF) 100 MCG/2ML IJ SOLN
50.0000 ug | INTRAMUSCULAR | Status: DC | PRN
  Administered 2020-05-09: 50 ug via INTRAVENOUS
  Filled 2020-05-09: qty 2

## 2020-05-09 NOTE — H&P (Addendum)
Claudia Patterson is a 39 y.o. female presenting for Labor.  Arrived via EMS at about 1922hrs Denies leaking or bleeding  Received her prenatal care starting at .27 weeks at [place] (from [place]). History is remarkable for cholelithiasis, syphilis twice (2010, 2014) and Language [employer] used for interview.  Is from [place], speaks [place]  States other 4 deliveries were normal and uncomplicated.  States with one she "had to get a shot to make the baby come".  OB History    Gravida  5   Para  4   Term  4   Preterm      AB      Living  4     SAB      TAB      Ectopic      Multiple      Live Births  4          Past Medical History:  Diagnosis Date  . Gallbladder calculus with acute cholecystitis and no obstruction   . Syphilis 2010   2010 & 2014  . 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 CHOLECYSTECTOMY  2019   Family History: family history includes Diabetes in her brother. Social History:  reports that she has never smoked. She has never used smokeless tobacco. She reports that she does not drink alcohol and does not use drugs.     Maternal Diabetes: No Genetic Screening: Declined Maternal Ultrasounds/Referrals: Normal Fetal Ultrasounds or other Referrals:  None Maternal Substance Abuse:  No Significant Maternal Medications:  None Significant Maternal Lab Results:  Group B Strep negative Other Comments:  None  Review of Systems  Constitutional: Negative for chills and fever.  Eyes: Negative for visual disturbance.  Respiratory: Negative for shortness of breath.   Gastrointestinal: Positive for abdominal pain. Negative for constipation, diarrhea, nausea and vomiting.  Genitourinary: Positive for pelvic pain and vaginal discharge. Negative for vaginal bleeding.  Neurological: Negative for dizziness and weakness.   Maternal Medical History:  Reason for admission: Contractions.   Nausea.  Contractions: Onset was 3-5 hours ago.   Frequency: regular.   Perceived severity is moderate.    Fetal activity: Perceived fetal activity is normal.   Last perceived fetal movement was within the past hour.    Prenatal complications: Cholelithiasis (in past).   No bleeding, PIH, placental abnormality, pre-eclampsia or preterm labor.   Prenatal Complications - Diabetes: none.    Dilation: 5 Effacement (%): 80 Station: -3 Exam by:: TLYTLE RN  Blood pressure 124/78, pulse 72, temperature 98.1 F (36.7 C), temperature source Oral, resp. rate 18, last menstrual period 08/07/2019. Maternal Exam:  Uterine Assessment: Contraction strength is firm.  Contraction frequency is regular.   Abdomen: Patient reports no abdominal tenderness. Estimated fetal weight is 6.5.   Fetal presentation: vertex  Introitus: Normal vulva. Normal vagina.  Ferning test: not done.  Nitrazine test: not done. Amniotic fluid character: not assessed.  Pelvis: adequate for delivery.   Cervix: Cervix evaluated by digital exam.     Fetal Exam Fetal Monitor Review: Mode: ultrasound.   Baseline rate: 135-140.  Variability: moderate (6-25 bpm).   Pattern: accelerations present and no decelerations.    Fetal State Assessment: Category I - tracings are normal.     Physical Exam Constitutional:      General: She is not in acute distress.    Appearance: Normal appearance. She is normal weight. She is not ill-appearing.  Cardiovascular:  Rate and Rhythm: Normal rate.  Pulmonary:     Effort: Pulmonary effort is normal. No respiratory distress.  Abdominal:     General: There is no distension.     Palpations: There is no mass.     Tenderness: There is no abdominal tenderness. There is no guarding or rebound.     Hernia: No hernia is present.  Genitourinary:    General: Normal vulva.     Comments: Dilation: 5 Effacement (%): 80 Cervical Position: Middle Station: -3 Presentation:  Vertex Exam by:: TLYTLE RN   Musculoskeletal:        General: Normal range of motion.     Cervical back: Normal range of motion.  Skin:    General: Skin is warm and dry.  Neurological:     General: No focal deficit present.     Mental Status: She is alert and oriented to person, place, and time.  Psychiatric:        Mood and Affect: Mood normal.        Behavior: Behavior normal.     Prenatal labs: ABO, Rh: --/--/O POS (10/26 1952) Antibody: NEG (10/26 1952) Rubella: Immune (03/31 1011) RPR: Non Reactive (08/16 0830)  HBsAg: Negative (03/31 1011)  HIV: Non Reactive (08/16 0830)  GBS: Negative/-- (10/08 1124)   Assessment/Plan: [place] IUP at [redacted]w[redacted]d Labor, early active phase GBS negative  Admit to Labor and Delivery Routine orders Anticipate SVD   Wynelle Bourgeois 05/09/2020, 9:26 PM

## 2020-05-09 NOTE — MAU Note (Signed)
Pt arrived via EMS in labor. 1-2 mins apart. Denies LOF or VB. +FM

## 2020-05-09 NOTE — Progress Notes (Signed)
Ipad interpretor used  Doing well Vitals:   05/09/20 1915 05/09/20 2006 05/09/20 2100  BP: 138/68 135/79 124/78  Pulse: 86 75 72  Resp: 18 18 18   Temp: 98.2 F (36.8 C) 98.1 F (36.7 C)   TempSrc: Oral Oral    FHR reassuring UCs regular  Appears somewhat uncomfortable, yet stoic  Does want some IV analgesia WIll give Fentanyl  Anticipate SVD

## 2020-05-10 ENCOUNTER — Encounter (HOSPITAL_COMMUNITY): Payer: Self-pay | Admitting: Family Medicine

## 2020-05-10 LAB — RPR
RPR Ser Ql: REACTIVE — AB
RPR Titer: 1:1 {titer}

## 2020-05-10 MED ORDER — ONDANSETRON HCL 4 MG PO TABS: 4.0000 mg | ORAL_TABLET | ORAL | Status: DC | PRN

## 2020-05-10 MED ORDER — DIBUCAINE (PERIANAL) 1 % EX OINT: 1.0000 "application " | TOPICAL_OINTMENT | CUTANEOUS | Status: DC | PRN

## 2020-05-10 MED ORDER — BENZOCAINE-MENTHOL 20-0.5 % EX AERO: 1.0000 "application " | INHALATION_SPRAY | CUTANEOUS | Status: DC | PRN

## 2020-05-10 MED ORDER — WITCH HAZEL-GLYCERIN EX PADS: 1.0000 "application " | MEDICATED_PAD | CUTANEOUS | Status: DC | PRN

## 2020-05-10 MED ORDER — ONDANSETRON HCL 4 MG/2ML IJ SOLN: 4.0000 mg | INTRAMUSCULAR | Status: DC | PRN

## 2020-05-10 MED ORDER — ACETAMINOPHEN 325 MG PO TABS: 650.0000 mg | ORAL_TABLET | ORAL | Status: DC | PRN

## 2020-05-10 MED ORDER — TETANUS-DIPHTH-ACELL PERTUSSIS 5-2.5-18.5 LF-MCG/0.5 IM SUSY: 0.5000 mL | PREFILLED_SYRINGE | Freq: Once | INTRAMUSCULAR | Status: DC

## 2020-05-10 MED ORDER — ZOLPIDEM TARTRATE 5 MG PO TABS: 5.0000 mg | ORAL_TABLET | Freq: Every evening | ORAL | Status: DC | PRN

## 2020-05-10 MED ORDER — PRENATAL MULTIVITAMIN CH
1.0000 | ORAL_TABLET | Freq: Every day | ORAL | Status: DC
  Administered 2020-05-10 – 2020-05-11 (×2): 1 via ORAL
  Filled 2020-05-10 (×2): qty 1

## 2020-05-10 MED ORDER — IBUPROFEN 600 MG PO TABS
600.0000 mg | ORAL_TABLET | Freq: Four times a day (QID) | ORAL | Status: DC
  Administered 2020-05-10 – 2020-05-11 (×6): 600 mg via ORAL
  Filled 2020-05-10 (×6): qty 1

## 2020-05-10 MED ORDER — SENNOSIDES-DOCUSATE SODIUM 8.6-50 MG PO TABS
2.0000 | ORAL_TABLET | ORAL | Status: DC
  Administered 2020-05-11: 2 via ORAL
  Filled 2020-05-10: qty 2

## 2020-05-10 MED ORDER — DIPHENHYDRAMINE HCL 25 MG PO CAPS: 25.0000 mg | ORAL_CAPSULE | Freq: Four times a day (QID) | ORAL | Status: DC | PRN

## 2020-05-10 MED ORDER — SIMETHICONE 80 MG PO CHEW: 80.0000 mg | CHEWABLE_TABLET | ORAL | Status: DC | PRN

## 2020-05-10 MED ORDER — COCONUT OIL OIL: 1.0000 "application " | TOPICAL_OIL | Status: DC | PRN

## 2020-05-10 NOTE — Progress Notes (Signed)
Post Partum Day 1 Subjective: no complaints, up ad lib, voiding and tolerating PO  Objective: Blood pressure 107/62, pulse 63, temperature 98.4 F (36.9 C), resp. rate 16, last menstrual period 08/07/2019, SpO2 99 %, unknown if currently breastfeeding.  Physical Exam:  General: alert, cooperative and no distress Lochia: appropriate Uterine Fundus: firm Incision: n/a DVT Evaluation: No evidence of DVT seen on physical exam.  Recent Labs    05/09/20 1936  HGB 11.9*  HCT 35.5*    Assessment/Plan: Plan for discharge tomorrow and Breastfeeding   LOS: 1 day   Wynelle Bourgeois 05/10/2020, 7:09 AM

## 2020-05-10 NOTE — Discharge Summary (Signed)
Postpartum Discharge Summary     Patient Name: Claudia Patterson DOB: May 02, 1981 MRN: [id number]  Date of admission: 05/09/2020 Delivery date:05/09/2020  Delivering provider: Seabron Spates  Date of discharge: 05/11/2020  Admitting diagnosis: Post-dates pregnancy [O48.0] Intrauterine pregnancy: [redacted]w[redacted]d    Secondary diagnosis:  Active Problems:   Language barrier, cultural differences   Vaginal delivery   Shoulder dystocia, delivered, current hospitalization  Additional problems: mild shoulder dystocia    Discharge diagnosis: Term Pregnancy Delivered and Mild shoulder dystocia                                              Post partum procedures:none Augmentation: none Complications: None except mild shoulder dystocia  Hospital course: Onset of Labor With Vaginal Delivery      39y.o. yo [id number] 37w4das admitted in Active Labor on 05/09/2020. Patient had an uncomplicated labor course as follows:  Membrane Rupture Time/Date: 11:00 PM ,05/09/2020   Delivery Method:Vaginal, Spontaneous   We had a mild shoulder dystocia lasting 45 seconds.  McRoberts and corkscrew maneuver used.  Cord gas sent Episiotomy: None  Lacerations:  None  Patient had an uncomplicated postpartum course.  She is ambulating, tolerating a regular diet, passing flatus, and urinating well. Patient is discharged home in stable condition on 05/11/20.  Newborn Data: Birth date:05/09/2020  Birth time:11:57 PM  Gender:Female  Living status:Living  Apgars:8 ,9  Weight:2736 g   Magnesium Sulfate received: No BMZ received: No Rhophylac:N/A MMR:N/A T-DaP:not given prenatally Flu: No Transfusion:No  Physical exam  Vitals:   05/10/20 0743 05/10/20 1300 05/10/20 1546 05/10/20 2030  BP: (!) 95/58 106/61 (!) 97/58 (!) 105/57  Pulse: 60 64 (!) 58 64  Resp: 18 18 17 15   Temp: 98 F (36.7 C) 98.3 F (36.8 C) 98.2 F (36.8 C) 98 F (36.7 C)  TempSrc: Oral Oral Oral Oral  SpO2:  97%  100%   General:  alert and cooperative Lochia: appropriate Uterine Fundus: firm Incision: N/A DVT Evaluation: No evidence of DVT seen on physical exam. Labs: Lab Results  Component Value Date   WBC 7.7 05/09/2020   HGB 11.9 (L) 05/09/2020   HCT 35.5 (L) 05/09/2020   MCV 92.7 05/09/2020   PLT 135 (L) 05/09/2020   No flowsheet data found. Edinburgh Score: Edinburgh Postnatal Depression Scale Screening Tool 05/10/2020  I have been able to laugh and see the funny side of things. (No Data)      After visit meds:  Allergies as of 05/11/2020   No Known Allergies     Medication List    TAKE these medications   CitraNatal 90 DHA 90-1 & 300 MG Misc Take 1 tablet by mouth daily.   ibuprofen 600 MG tablet Commonly known as: ADVIL Take 1 tablet (600 mg total) by mouth every 6 (six) hours as needed.      Please schedule this patient for Postpartum visit in: 4 weeks with the following provider: Any provider EIther virtual or inperson, needs interpretor For C/S patients schedule nurse incision check in weeks 2 weeks: no Low risk pregnancy complicated by: none Delivery mode:  SVD Anticipated Birth Control:  other/unsure PP Procedures needed: none   Discharge home in stable condition Infant Feeding: Breast Infant Disposition:home with mother Discharge instruction: per After Visit Summary and Postpartum booklet. Activity: Advance as tolerated. Pelvic rest for  6 weeks.  Diet: routine diet Anticipated Birth Control: declines Postpartum Appointment:4 weeks Additional Postpartum F/U: none Future Appointments:  Future Appointments  Date Time Provider Plum  06/16/2020  9:50 AM Gavin Pound, CNM [facility] None      05/11/2020 Myrtis Ser, CNM  9:33 AM

## 2020-05-11 ENCOUNTER — Other Ambulatory Visit: Payer: Self-pay | Admitting: Obstetrics and Gynecology

## 2020-05-11 LAB — T.PALLIDUM AB, TOTAL: T Pallidum Abs: REACTIVE — AB

## 2020-05-11 MED ORDER — IBUPROFEN 600 MG PO TABS: 600.0000 mg | ORAL_TABLET | Freq: Four times a day (QID) | ORAL | 0 refills | Status: DC | PRN

## 2020-05-11 NOTE — Discharge Instructions (Signed)

## 2020-05-11 NOTE — Lactation Note (Signed)
This note was copied from a baby's chart. Lactation Consultation Note  Patient Name: Claudia Patterson ASUOR'V Date: 05/11/2020   Infant is 37 hrs old. "[name]" (ID# AGAT) was used. Mom is a P5. The nurse had provided a nipple shield so that infant could transition from bottle to the breast (see B. Daly's note about observation of sucking). Mom has very everted nipples & Mom said she did not think she would need them & did not see the need to take them home. Nipple shields were discarded.   Per Mom, she is using formula now b/c she doesn't have enough milk b/c she doesn't have access to the warm foods she wants to eat. She will be able to eat those foods once she goes home.   Although I do not think she'll need it, I did share with her the name of the IBCLC who works at her pediatrician's office.    Lurline Hare [facility] 05/11/2020, 1:45 PM

## 2020-05-11 NOTE — Progress Notes (Signed)
[employer] Language Video Interpreter, [phone] Drusilla Kanner, used for discharge teaching for mother and infant. Family verbalizes understanding and states all questions were answered.

## 2020-05-12 ENCOUNTER — Encounter: Payer: Medicaid Other | Admitting: Certified Nurse Midwife

## 2020-05-15 ENCOUNTER — Inpatient Hospital Stay (HOSPITAL_COMMUNITY)
Admission: AD | Admit: 2020-05-15 | Payer: Medicaid Other | Source: Home / Self Care | Admitting: Obstetrics and Gynecology

## 2020-05-15 ENCOUNTER — Inpatient Hospital Stay (HOSPITAL_COMMUNITY): Payer: Medicaid Other

## 2020-06-16 ENCOUNTER — Encounter: Payer: Self-pay | Admitting: General Practice

## 2020-06-16 ENCOUNTER — Ambulatory Visit (INDEPENDENT_AMBULATORY_CARE_PROVIDER_SITE_OTHER): Payer: Medicaid Other

## 2020-06-16 ENCOUNTER — Other Ambulatory Visit: Payer: Self-pay

## 2020-06-16 DIAGNOSIS — Z789 Other specified health status: Secondary | ICD-10-CM

## 2020-06-16 NOTE — Progress Notes (Signed)
Post Partum Visit Note  Claudia Patterson is a 39 y.o. K1S0109 female who presents for a postpartum visit. She is 5 weeks postpartum following a normal spontaneous vaginal delivery.  I have fully reviewed the prenatal and intrapartum course. The delivery was at 39.3 gestational weeks.  Anesthesia: none. Postpartum course has been uncomplicated. Baby is doing well. Baby is feeding by both breast and bottle - Lucien Mons Start. Bleeding no bleeding. Bowel function is normal. Bladder function is normal. Patient is not sexually active. Contraception method is none. Postpartum depression screening: negative.   The pregnancy intention screening data noted above was reviewed. Potential methods of contraception were discussed. The patient elected to proceed with No Method - Other Reason.    Edinburgh Postnatal Depression Scale - 06/16/20 1020      Edinburgh Postnatal Depression Scale:  In the Past 7 Days   I have been able to laugh and see the funny side of things. 0    I have looked forward with enjoyment to things. 0    I have blamed myself unnecessarily when things went wrong. 0    I have been anxious or worried for no good reason. 0    I have felt scared or panicky for no good reason. 0    Things have been getting on top of me. 0    I have been so unhappy that I have had difficulty sleeping. 0    I have felt sad or miserable. 0    I have been so unhappy that I have been crying. 0    The thought of harming myself has occurred to me. 0    Edinburgh Postnatal Depression Scale Total 0            The following portions of the patient's history were reviewed and updated as appropriate: allergies, current medications, past family history, past medical history, past social history, past surgical history and problem list.  Review of Systems Pertinent items noted in HPI and remainder of comprehensive ROS otherwise negative.    Objective:  LMP 08/07/2019    General:  alert, cooperative and  no distress   Breasts:  inspection negative, no nipple discharge or bleeding, no masses or nodularity palpable  Lungs: clear to auscultation bilaterally  Heart:  regular rate and rhythm  Abdomen: normal findings: bowel sounds normal   Vulva:  not evaluated  Vagina: not evaluated  Cervix:  not evaluated  Corpus: not examined  Adnexa:  not evaluated  Rectal Exam: Not performed.        Assessment:    5 weeks postpartum exam.  Pap Smear UTD Breastfeeding Normal Involuation Language Barrier Plan:  Okay to resume normal activities Informed that she may return to office when she is ready to initiate a birth control method.  Husband provides assistance with interpretations as Status interpreter unavailable.    Essential components of care per ACOG recommendations:  1.  Mood and well being: Patient with negative depression screening today. Reviewed local resources for support.  - Patient does not use tobacco.  - hx of drug use? No    2. Infant care and feeding:  -Patient currently breastmilk feeding? Yes  Reviewed importance of draining breast regularly to support lactation. -Social determinants of health (SDOH) reviewed in EPIC. No concerns-Has transportation and WIC established.  3. Sexuality, contraception and birth spacing - Patient does not want a pregnancy in the next year.  Desired family size is 5 children.  - Reviewed  forms of contraception in tiered fashion. Patient desired no method today.   - Discussed birth spacing of 18 months  4. Sleep and fatigue -Encouraged family/partner/community support of 4 hrs of uninterrupted sleep to help with mood and fatigue  5. Physical Recovery  - Discussed patients delivery and complications - Patient had a no laceration. - Patient has urinary incontinence? No  - Patient is safe to resume physical and sexual activity  6.  Health Maintenance - Last pap smear done April 2021 and was normal with negative HPV. No Mammogram  7. No  Chronic Disease - PCP follow up  Cherre Robins, CNM [facility] [employer]

## 2020-09-08 ENCOUNTER — Other Ambulatory Visit (HOSPITAL_COMMUNITY)
Admission: RE | Admit: 2020-09-08 | Discharge: 2020-09-08 | Disposition: A | Discharge status: Home / Self Care | Payer: Medicaid Other | Source: Ambulatory Visit | Attending: Advanced Practice Midwife | Admitting: Advanced Practice Midwife

## 2020-09-08 ENCOUNTER — Ambulatory Visit (INDEPENDENT_AMBULATORY_CARE_PROVIDER_SITE_OTHER): Payer: Medicaid Other | Admitting: Advanced Practice Midwife

## 2020-09-08 ENCOUNTER — Encounter: Payer: Self-pay | Admitting: Advanced Practice Midwife

## 2020-09-08 ENCOUNTER — Other Ambulatory Visit: Payer: Self-pay

## 2020-09-08 VITALS — BP 120/84 | HR 74 | Temp 97.9°F | Wt 106.0 lb

## 2020-09-08 DIAGNOSIS — N926 Irregular menstruation, unspecified: Secondary | ICD-10-CM | POA: Insufficient documentation

## 2020-09-08 LAB — POCT URINE PREGNANCY: Preg Test, Ur: NEGATIVE

## 2020-09-08 NOTE — Progress Notes (Signed)
  Subjective:     Patient ID: Claudia Patterson, female   DOB: [birth year], 40 y.o.   MRN: [id number]  Claudia Patterson is a 40 y.o. [id number] who is here today with irregular bleeding. She reports that since January she has had some irregular spotting that she notes when she uses the bathroom. She reports that this happens about 2-3 times per week, and has been every week since the beginning of January. She has not needed to use a pad for any of these bleeding episodes. She reports that the blood is brown/pinkish. She is exclusively breastfeeding at this time. She does not identify any factors that increase the bleeding. It does not seem to occur after intercourse. She denies any pain either now or when she is having the bleeding. She denies any other discharge, odor or irritation.     Review of Systems  All other systems reviewed and are negative.      Objective:   Physical Exam Vitals and nursing note reviewed. Exam conducted with a chaperone present.  Constitutional:      General: She is not in acute distress. HENT:     Head: Normocephalic.  Cardiovascular:     Rate and Rhythm: Normal rate.  Pulmonary:     Effort: Pulmonary effort is normal.  Abdominal:     Palpations: Abdomen is soft.     Tenderness: There is no abdominal tenderness.  Genitourinary:    Comments:  External: no lesion Vagina: scant amount of brown blood seen  Cervix: pink, smooth, no ectropion, no contact bleeding,  no CMT Uterus: NSSC Adnexa: NT  Skin:    General: Skin is warm and dry.  Neurological:     Mental Status: She is alert and oriented to person, place, and time.  Psychiatric:        Mood and Affect: Mood normal.        Behavior: Behavior normal.    Results for orders placed or performed in visit on 09/08/20 (from the past 24 hour(s))  POCT urine pregnancy     Status: Normal   Collection Time: 09/08/20  9:06 AM  Result Value Ref Range   Preg Test, Ur Negative Negative       Assessment:      1. Irregular menstrual bleeding        Plan:     - Wet prep - GC/CT collected DW patient that it is not uncommon due to LAM to have some irregular spotting associated with this.  No obvious sign of infection, delayed wound healing (from childbirth), or cervicitis Patient to continue to monitor bleeding and FU once done breastfeeding if bleeding continues without resumption of regular menses.   Interpretor used for the entire patient encounter.   Thressa Sheller DNP, CNM  09/08/20  10:34 AM

## 2020-09-11 LAB — CERVICOVAGINAL ANCILLARY ONLY
Bacterial Vaginitis (gardnerella): NEGATIVE
Candida Glabrata: NEGATIVE
Candida Vaginitis: NEGATIVE
Chlamydia: NEGATIVE
Comment: NEGATIVE
Comment: NEGATIVE
Comment: NEGATIVE
Comment: NEGATIVE
Comment: NEGATIVE
Comment: NORMAL
Neisseria Gonorrhea: NEGATIVE
Trichomonas: NEGATIVE

## 2021-10-10 ENCOUNTER — Encounter (INDEPENDENT_AMBULATORY_CARE_PROVIDER_SITE_OTHER): Payer: Self-pay | Admitting: Primary Care

## 2021-10-10 ENCOUNTER — Ambulatory Visit (INDEPENDENT_AMBULATORY_CARE_PROVIDER_SITE_OTHER): Payer: Medicaid Other | Admitting: Primary Care

## 2021-10-10 ENCOUNTER — Other Ambulatory Visit (HOSPITAL_COMMUNITY)
Admission: RE | Admit: 2021-10-10 | Discharge: 2021-10-10 | Disposition: A | Discharge status: Home / Self Care | Payer: Medicaid Other | Source: Ambulatory Visit | Attending: Primary Care | Admitting: Primary Care

## 2021-10-10 ENCOUNTER — Other Ambulatory Visit: Payer: Self-pay

## 2021-10-10 VITALS — BP 137/85 | HR 68 | Temp 97.8°F | Ht <= 58 in | Wt 119.4 lb

## 2021-10-10 DIAGNOSIS — R300 Dysuria: Secondary | ICD-10-CM | POA: Diagnosis not present

## 2021-10-10 DIAGNOSIS — N898 Other specified noninflammatory disorders of vagina: Secondary | ICD-10-CM

## 2021-10-10 DIAGNOSIS — N390 Urinary tract infection, site not specified: Secondary | ICD-10-CM

## 2021-10-10 DIAGNOSIS — Y93E8 Activity, other personal hygiene: Secondary | ICD-10-CM

## 2021-10-10 LAB — POCT URINALYSIS DIP (CLINITEK)
Bilirubin, UA: NEGATIVE
Glucose, UA: NEGATIVE mg/dL
Ketones, POC UA: NEGATIVE mg/dL
Nitrite, UA: NEGATIVE
POC PROTEIN,UA: NEGATIVE
Spec Grav, UA: 1.025 (ref 1.010–1.025)
Urobilinogen, UA: 0.2 E.U./dL
pH, UA: 5.5 (ref 5.0–8.0)

## 2021-10-10 MED ORDER — PHENAZOPYRIDINE HCL 100 MG PO TABS: 100.0000 mg | ORAL_TABLET | Freq: Three times a day (TID) | ORAL | 0 refills | Status: DC | PRN

## 2021-10-10 NOTE — Patient Instructions (Signed)
Urinary Tract Infection, Adult A urinary tract infection (UTI) is an infection of any part of the urinary tract. The urinary tract includes the kidneys, ureters, bladder, and urethra. These organs make, store, and get rid of urine in the body. An upper UTI affects the ureters and kidneys. A lower UTI affects the bladder and urethra. What are the causes? Most urinary tract infections are caused by bacteria in your genital area around your urethra, where urine leaves your body. These bacteria grow and cause inflammation of your urinary tract. What increases the risk? You are more likely to develop this condition if: You have a urinary catheter that stays in place. You are not able to control when you urinate or have a bowel movement (incontinence). You are female and you: Use a spermicide or diaphragm for birth control. Have low estrogen levels. Are pregnant. You have certain genes that increase your risk. You are sexually active. You take antibiotic medicines. You have a condition that causes your flow of urine to slow down, such as: An enlarged prostate, if you are female. Blockage in your urethra. A kidney stone. A nerve condition that affects your bladder control (neurogenic bladder). Not getting enough to drink, or not urinating often. You have certain medical conditions, such as: Diabetes. A weak disease-fighting system (immunesystem). Sickle cell disease. Gout. Spinal cord injury. What are the signs or symptoms? Symptoms of this condition include: Needing to urinate right away (urgency). Frequent urination. This may include small amounts of urine each time you urinate. Pain or burning with urination. Blood in the urine. Urine that smells bad or unusual. Trouble urinating. Cloudy urine. Vaginal discharge, if you are female. Pain in the abdomen or the lower back. You may also have: Vomiting or a decreased appetite. Confusion. Irritability or tiredness. A fever or  chills. Diarrhea. The first symptom in older adults may be confusion. In some cases, they may not have any symptoms until the infection has worsened. How is this diagnosed? This condition is diagnosed based on your medical history and a physical exam. You may also have other tests, including: Urine tests. Blood tests. Tests for STIs (sexually transmitted infections). If you have had more than one UTI, a cystoscopy or imaging studies may be done to determine the cause of the infections. How is this treated? Treatment for this condition includes: Antibiotic medicine. Over-the-counter medicines to treat discomfort. Drinking enough water to stay hydrated. If you have frequent infections or have other conditions such as a kidney stone, you may need to see a health care provider who specializes in the urinary tract (urologist). In rare cases, urinary tract infections can cause sepsis. Sepsis is a life-threatening condition that occurs when the body responds to an infection. Sepsis is treated in the hospital with IV antibiotics, fluids, and other medicines. Follow these instructions at home: Medicines Take over-the-counter and prescription medicines only as told by your health care provider. If you were prescribed an antibiotic medicine, take it as told by your health care provider. Do not stop using the antibiotic even if you start to feel better. General instructions Make sure you: Empty your bladder often and completely. Do not hold urine for long periods of time. Empty your bladder after sex. Wipe from front to back after urinating or having a bowel movement if you are female. Use each tissue only one time when you wipe. Drink enough fluid to keep your urine pale yellow. Keep all follow-up visits. This is important. Contact a health care provider   if: Your symptoms do not get better after 1-2 days. Your symptoms go away and then return. Get help right away if: You have severe pain in your  back or your lower abdomen. You have a fever or chills. You have nausea or vomiting. Summary A urinary tract infection (UTI) is an infection of any part of the urinary tract, which includes the kidneys, ureters, bladder, and urethra. Most urinary tract infections are caused by bacteria in your genital area. Treatment for this condition often includes antibiotic medicines. If you were prescribed an antibiotic medicine, take it as told by your health care provider. Do not stop using the antibiotic even if you start to feel better. Keep all follow-up visits. This is important. This information is not intended to replace advice given to you by your health care provider. Make sure you discuss any questions you have with your health care provider. Document Revised: 02/11/2020 Document Reviewed: 02/11/2020 Elsevier Patient Education  2022 [employer]  

## 2021-10-10 NOTE — Progress Notes (Signed)
? ?   [facility] ? ? ?Subjective:  ?  ? Claudia Patterson is a 41 y.o. female ([place]om[place] husband present per patient consent) who presents for evaluation of an abnormal vaginal discharge. Symptoms have been present for 2  months . Vaginal symptoms: local irritation and vulvar itching. Contraception: Nexplanon. She denies abnormal bleeding, odor, post coital bleeding, and urinary symptoms of dysuria Sexually transmitted infection risk: very low risk of STD exposure. Menstrual flow: none. ? ?The following portions of the patient's history were reviewed and updated as appropriate: allergies, current medications, past family history, past medical history, past social history, and past surgical history. ? ? ?Review of Systems ?Pertinent items noted in HPI and remainder of comprehensive ROS otherwise negative.  ?  ?Objective:  ?BP 137/85 (BP Location: Right Arm, Patient Position: Sitting, Cuff Size: Normal)   Pulse 68   Temp 97.8 ?F (36.6 ?C) (Oral)   Ht 4\' 10"  (1.473 m)   Wt 119 lb 6.4 oz (54.2 kg)   SpO2 98%   Breastfeeding Yes   BMI 24.95 kg/m?  ? ? BP 137/85 (BP Location: Right Arm, Patient Position: Sitting, Cuff Size: Normal)   Pulse 68   Temp 97.8 ?F (36.6 ?C) (Oral)   Ht 4\' 10"  (1.473 m)   Wt 119 lb 6.4 oz (54.2 kg)   SpO2 98%   Breastfeeding Yes   BMI 24.95 kg/m?  ?General appearance: alert, appears stated age, and no distress ?Head: Normocephalic, without obvious abnormality, atraumatic ?Eyes: conjunctivae/corneas clear. PERRL, EOM's intact. Fundi benign. ?Nose: Nares normal. Septum midline. Mucosa normal. No drainage or sinus tenderness. ?Neck: no adenopathy, no carotid bruit, no JVD, supple, symmetrical, trachea midline, and thyroid not enlarged, symmetric, no tenderness/mass/nodules ?Lungs: clear to auscultation bilaterally ?Heart: regular rate and rhythm, S1, S2 normal, no murmur, click, rub or gallop ?Abdomen: soft, non-tender; bowel sounds normal; no masses,  no  organomegaly ?Extremities: extremities normal, atraumatic, no cyanosis or edema ?Skin: Skin color, texture, turgor normal. No rashes or lesions ?Lymph nodes: Cervical, supraclavicular, and axillary nodes normal. ?Neurologic: Alert and oriented X 3, normal strength and tone. Normal symmetric reflexes. Normal coordination and gait  ?  ?Assessment:  ?Claudia Patterson was seen today for vaginal itching. ? ?Diagnoses and all orders for this visit: ? ?Vaginal irritation ?-     Cervicovaginal ancillary only ? ?Dysuria ?-     POCT URINALYSIS DIP (CLINITEK) ?-     Urine Culture ?-     phenazopyridine (PYRIDIUM) 100 MG tablet; Take 1 tablet (100 mg total) by mouth 3 (three) times daily as needed for pain. ? ?Activities involving personal hygiene ?Discussed risk factors for UTI ? ?Urinary tract infection without hematuria, site unspecified ?-     Urine Culture ?-     phenazopyridine (PYRIDIUM) 100 MG tablet; Take 1 tablet (100 mg total) by mouth 3 (three) times daily as needed for pain. ? ?  ?This note has been created with . Any transcriptional errors are unintentional.  ? ?Kendall Flack, NP ?10/10/2021, 11:04 AM  ? ?

## 2021-10-12 LAB — CERVICOVAGINAL ANCILLARY ONLY
Bacterial Vaginitis (gardnerella): POSITIVE — AB
Candida Glabrata: NEGATIVE
Candida Vaginitis: POSITIVE — AB
Chlamydia: NEGATIVE
Comment: NEGATIVE
Comment: NEGATIVE
Comment: NEGATIVE
Comment: NEGATIVE
Comment: NEGATIVE
Comment: NORMAL
Neisseria Gonorrhea: NEGATIVE
Trichomonas: NEGATIVE

## 2021-10-13 LAB — URINE CULTURE: Organism ID, Bacteria: NO GROWTH

## 2021-10-15 ENCOUNTER — Other Ambulatory Visit (INDEPENDENT_AMBULATORY_CARE_PROVIDER_SITE_OTHER): Payer: Self-pay | Admitting: Primary Care

## 2021-10-15 DIAGNOSIS — B379 Candidiasis, unspecified: Secondary | ICD-10-CM

## 2021-10-15 DIAGNOSIS — N760 Acute vaginitis: Secondary | ICD-10-CM

## 2021-10-15 MED ORDER — METRONIDAZOLE 500 MG PO TABS: 500.0000 mg | ORAL_TABLET | Freq: Two times a day (BID) | ORAL | 0 refills | Status: DC

## 2021-10-15 MED ORDER — FLUCONAZOLE 150 MG PO TABS: 150.0000 mg | ORAL_TABLET | Freq: Every day | ORAL | 1 refills | Status: DC

## 2021-10-16 ENCOUNTER — Telehealth (INDEPENDENT_AMBULATORY_CARE_PROVIDER_SITE_OTHER): Payer: Self-pay

## 2021-10-16 NOTE — Telephone Encounter (Signed)
-----   Message from Grayce Sessions, NP sent at 10/15/2021  1:45 PM EDT ----- ?You have Bacterial Vaginosis and a yeast infection . Sent in a prescription for metronidazole to treat the bacteria infection.  You take this medication twice a day for 7 days. Be sure that you do not drink alcohol when you take this medication because the combination can give you severe nausea and vomiting. Once you complete this medication take 1 tablet of Fluconazole to treat the yeast infection.   ?

## 2021-10-16 NOTE — Telephone Encounter (Signed)
Patient verified date of birth. She is aware of BV and yeast infection. Advised patient of medication sent to pharmacy. Provided instructions on how to properly take medications. She verbalized understanding. Nat Christen, CMA  ?

## 2022-07-11 ENCOUNTER — Other Ambulatory Visit: Payer: Self-pay

## 2022-07-11 ENCOUNTER — Encounter (HOSPITAL_COMMUNITY): Payer: Self-pay

## 2022-07-11 ENCOUNTER — Emergency Department (HOSPITAL_COMMUNITY)
Admission: EM | Admit: 2022-07-11 | Discharge: 2022-07-12 | Discharge status: Against Medical Advice | Payer: Medicaid Other | Attending: Medical | Admitting: Medical

## 2022-07-11 DIAGNOSIS — Z1152 Encounter for screening for COVID-19: Secondary | ICD-10-CM | POA: Insufficient documentation

## 2022-07-11 DIAGNOSIS — Z5321 Procedure and treatment not carried out due to patient leaving prior to being seen by health care provider: Secondary | ICD-10-CM | POA: Insufficient documentation

## 2022-07-11 DIAGNOSIS — J101 Influenza due to other identified influenza virus with other respiratory manifestations: Secondary | ICD-10-CM | POA: Diagnosis not present

## 2022-07-11 DIAGNOSIS — R059 Cough, unspecified: Secondary | ICD-10-CM | POA: Diagnosis present

## 2022-07-11 LAB — RESP PANEL BY RT-PCR (RSV, FLU A&B, COVID)  RVPGX2
Influenza A by PCR: NEGATIVE
Influenza B by PCR: POSITIVE — AB
Resp Syncytial Virus by PCR: NEGATIVE
SARS Coronavirus 2 by RT PCR: NEGATIVE

## 2022-07-11 NOTE — ED Triage Notes (Signed)
Pt c/o dry cough, fever, chills, nausea, bodyachesx2d

## 2022-07-11 NOTE — ED Provider Triage Note (Signed)
Emergency Medicine Provider Triage Evaluation Note  Claudia Patterson , a 41 y.o. female  was evaluated in triage.  Pt complains of cough, sore throat, runny nose, fatiguexseveral days. No chest pain or SOB.  Review of Systems  Positive: Cough, runny nose Negative: Chest pain, SOB  Physical Exam  There were no vitals taken for this visit. Gen:   Awake, no distress   Resp:  Normal effort  MSK:   Moves extremities without difficulty   Medical Decision Making  Medically screening exam initiated at 5:37 PM.  Appropriate orders placed.  Nancy Nordmann was informed that the remainder of the evaluation will be completed by another provider, this initial triage assessment does not replace that evaluation, and the importance of remaining in the ED until their evaluation is complete.     Pete Pelt, [place] 07/11/22 1738

## 2022-07-12 NOTE — ED Notes (Signed)
Pt did not answer.

## 2022-07-12 NOTE — ED Notes (Signed)
Patient left on own accord °

## 2022-07-19 ENCOUNTER — Ambulatory Visit (INDEPENDENT_AMBULATORY_CARE_PROVIDER_SITE_OTHER): Payer: Medicaid Other

## 2022-07-19 ENCOUNTER — Ambulatory Visit (HOSPITAL_COMMUNITY)
Admission: EM | Admit: 2022-07-19 | Discharge: 2022-07-19 | Disposition: A | Discharge status: Home / Self Care | Payer: Medicaid Other | Attending: Emergency Medicine | Admitting: Emergency Medicine

## 2022-07-19 ENCOUNTER — Encounter (HOSPITAL_COMMUNITY): Payer: Self-pay | Admitting: *Deleted

## 2022-07-19 DIAGNOSIS — G9331 Postviral fatigue syndrome: Secondary | ICD-10-CM

## 2022-07-19 DIAGNOSIS — R051 Acute cough: Secondary | ICD-10-CM

## 2022-07-19 MED ORDER — GUAIFENESIN ER 600 MG PO TB12: 600.0000 mg | ORAL_TABLET | Freq: Two times a day (BID) | ORAL | 0 refills | Status: DC

## 2022-07-19 MED ORDER — BENZONATATE 100 MG PO CAPS: 100.0000 mg | ORAL_CAPSULE | Freq: Two times a day (BID) | ORAL | 0 refills | Status: DC | PRN

## 2022-07-19 MED ORDER — IPRATROPIUM BROMIDE 0.03 % NA SOLN: 2.0000 | Freq: Two times a day (BID) | NASAL | 0 refills | Status: DC

## 2022-07-19 NOTE — Discharge Instructions (Signed)
The symptoms of weakness and fatigue are secondary to post influenza syndrome. This can last for days to weeks. Take naps if needed, drink plenty of water. Your chest x-ray shows no signs of pneumonia. I have called in a prescription medication to help with your nasal congestion and your cough.

## 2022-07-19 NOTE — ED Triage Notes (Signed)
Pt dx with flu B at Er on 07/11/2022 pt eloped   She states she has cough, fever, congestion x 1 week taking tylenol without relief.

## 2022-07-19 NOTE — ED Provider Notes (Signed)
[facility]    CSN: [id number] Arrival date & time: 07/19/22  1431      History   Chief Complaint Chief Complaint  Patient presents with   Cough   Fever   Influenza    HPI Claudia Patterson is a 42 y.o. female.   Pleasant 42 year old female presents today due to concerns of continued cough and fever.  She went to the emergency room on 07/11/2022, had a respiratory panel which tested positive for influenza B, however patient eloped and did not take treatment.  She reports she has been fatigued and weak since.  On 12/28, she was also having nausea, vomiting, diarrhea, but states that since has resolved.  She is still having some nausea and decreased appetite, but no further vomiting.  She states she is still having a fever.  She has been trying to take over-the-counter Tylenol with no improvement to her symptoms.  States she is coughing up thick chunks of yellow sputum.   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 Authorizing Provider  benzonatate (TESSALON)  100 MG capsule Take 1 capsule (100 mg total) by mouth 2 (two) times daily as needed for cough. 07/19/22  Yes Kiora Hallberg L, PA  etonogestrel (NEXPLANON) 68 MG IMPL implant 1 each by Subdermal route once.   Yes [provider]  guaiFENesin (MUCINEX) 600 MG 12 hr tablet Take 1 tablet (600 mg total) by mouth 2 (two) times daily. 07/19/22  Yes Lelani Garnett L, PA  ipratropium (ATROVENT) 0.03 % nasal spray Place 2 sprays into both nostrils every 12 (twelve) hours. 07/19/22  Yes Girolamo Lortie L, PA    Family History Family History  Problem Relation Age of Onset   Diabetes Brother     Social History Social History   Tobacco Use   Smoking status: Never   Smokeless tobacco: Never  Vaping Use   Vaping Use: Never used  Substance Use Topics   Alcohol use: Never   Drug use: Never     Allergies   Patient has no known allergies.   Review of Systems Review of Systems  Constitutional:  Positive for fever.  Respiratory:  Positive for cough.   As per HPI   Physical Exam Triage Vital Signs ED Triage Vitals  Enc  Vitals Group     BP 07/19/22 1714 128/86     Pulse Rate 07/19/22 1714 90     Resp 07/19/22 1714 18     Temp 07/19/22 1714 98.3 F (36.8 C)     Temp Source 07/19/22 1714 Oral     SpO2 07/19/22 1714 99 %     Weight --      Height --      Head Circumference --      Peak Flow --      Pain Score 07/19/22 1713 0     Pain Loc --      Pain Edu? --      Excl. in [place]? --    No data found.  Updated Vital Signs BP 128/86 (BP Location: Left Arm)   Pulse 90   Temp 98.3 F (36.8 C) (Oral)   Resp 18   LMP  (Approximate)   SpO2 99%   Visual Acuity Right Eye Distance:   Left Eye Distance:   Bilateral Distance:    Right Eye Near:   Left Eye Near:    Bilateral Near:     Physical Exam Vitals and nursing note reviewed.  Constitutional:      General: She is not in acute distress.    Appearance: Normal appearance. She is well-developed and normal weight. She is not  ill-appearing, toxic-appearing or diaphoretic.  HENT:     Head: Normocephalic and atraumatic.     Right Ear: Tympanic membrane, ear canal and external ear normal. There is no impacted cerumen.     Left Ear: Tympanic membrane, ear canal and external ear normal. There is no impacted cerumen.     Nose: Nose normal. No congestion or rhinorrhea.     Mouth/Throat:     Mouth: Mucous membranes are moist.     Pharynx: No oropharyngeal exudate or posterior oropharyngeal erythema.  Eyes:     General: No scleral icterus.       Right eye: No discharge.        Left eye: No discharge.     Extraocular Movements: Extraocular movements intact.     Conjunctiva/sclera: Conjunctivae normal.     Pupils: Pupils are equal, round, and reactive to light.  Cardiovascular:     Rate and Rhythm: Normal rate and regular rhythm.     Pulses: Normal pulses.     Heart sounds: Normal heart sounds. No murmur heard.    No gallop.  Pulmonary:     Effort: Pulmonary effort is normal. No respiratory distress.     Breath sounds: Normal breath sounds. No stridor. No wheezing, rhonchi or rales.  Chest:     Chest wall: No tenderness.  Abdominal:     Palpations: Abdomen is soft.     Tenderness: There is no abdominal tenderness.  Musculoskeletal:        General: No swelling or tenderness. Normal range of motion.     Cervical back: Normal range of motion and neck supple. No tenderness.     Right lower leg: No edema.     Left lower leg: No edema.  Lymphadenopathy:     Cervical: No cervical adenopathy.  Skin:    General: Skin is warm and dry.     Capillary Refill: Capillary refill takes less than 2 seconds.     Findings: No erythema or rash.  Neurological:     General: No focal deficit present.     Mental Status: She is alert and oriented to person, place, and  time.  Psychiatric:        Mood and Affect: Mood normal.      UC Treatments / Results  Labs (all labs ordered are listed, but only abnormal results are  displayed) Labs Reviewed - No data to display  EKG   Radiology DG Chest 2 View  Result Date: 07/19/2022 CLINICAL DATA:  Cough, fatigue, persistent fever.  Flu 1 week ago. EXAM: CHEST - 2 VIEW COMPARISON:  None Available. FINDINGS: The cardiomediastinal contours are normal. Minimal bronchial thickening. Pulmonary vasculature is normal. No consolidation, pleural effusion, or pneumothorax. No acute osseous abnormalities are seen. IMPRESSION: Minimal bronchial thickening without focal airspace disease. Electronically Signed   By: Narda Rutherford M.D.   On: 07/19/2022 17:50    Procedures Procedures (including critical care time)  Medications Ordered in UC Medications - No data to display  Initial Impression / Assessment and Plan / UC Course  I have reviewed the triage vital signs and the nursing notes.  Pertinent labs & imaging results that were available during my care of the patient were reviewed by me and considered in my medical decision making (see chart for details).     Post-influenza syndrome -patient's fatigue and weakness likely secondary to influenza B 8 days ago.  Vital signs are stable, exam unremarkable.  Chest x-ray does not show any signs of pneumonia. Acute cough -viral, secondary to recent influenza.  Will do Mucinex and Tessalon as needed to help with viral cough, Atrovent nasal spray to help with nasal congestion.   Final Clinical Impressions(s) / UC Diagnoses   Final diagnoses:  Post-influenza syndrome  Acute cough     Discharge Instructions      The symptoms of weakness and fatigue are secondary to post influenza syndrome. This can last for days to weeks. Take naps if needed, drink plenty of water. Your chest x-ray shows no signs of pneumonia. I have called in a prescription medication to help with your nasal congestion and your cough.     ED Prescriptions     Medication Sig Dispense Auth. Provider   guaiFENesin (MUCINEX) 600 MG 12 hr tablet Take 1  tablet (600 mg total) by mouth 2 (two) times daily. 20 tablet Chistopher Mangino L, PA   ipratropium (ATROVENT) 0.03 % nasal spray Place 2 sprays into both nostrils every 12 (twelve) hours. 30 mL Cristie Mckinney L, PA   benzonatate (TESSALON) 100 MG capsule Take 1 capsule (100 mg total) by mouth 2 (two) times daily as needed for cough. 20 capsule Calayah Guadarrama L, PA      PDMP not reviewed this encounter.   Maretta Bees, [place] 07/19/22 1840

## 2022-08-01 ENCOUNTER — Telehealth (INDEPENDENT_AMBULATORY_CARE_PROVIDER_SITE_OTHER): Payer: Medicaid Other | Admitting: Primary Care

## 2022-08-15 ENCOUNTER — Ambulatory Visit (INDEPENDENT_AMBULATORY_CARE_PROVIDER_SITE_OTHER): Payer: Medicaid Other | Admitting: Primary Care

## 2022-08-15 ENCOUNTER — Encounter (INDEPENDENT_AMBULATORY_CARE_PROVIDER_SITE_OTHER): Payer: Self-pay | Admitting: Primary Care

## 2022-08-15 ENCOUNTER — Other Ambulatory Visit (HOSPITAL_COMMUNITY)
Admission: RE | Admit: 2022-08-15 | Discharge: 2022-08-15 | Disposition: A | Discharge status: Home / Self Care | Payer: Medicaid Other | Source: Ambulatory Visit | Attending: Primary Care | Admitting: Primary Care

## 2022-08-15 VITALS — BP 119/81 | HR 79 | Resp 16 | Ht <= 58 in | Wt 114.6 lb

## 2022-08-15 DIAGNOSIS — N898 Other specified noninflammatory disorders of vagina: Secondary | ICD-10-CM | POA: Insufficient documentation

## 2022-08-15 DIAGNOSIS — N912 Amenorrhea, unspecified: Secondary | ICD-10-CM

## 2022-08-15 DIAGNOSIS — Z1231 Encounter for screening mammogram for malignant neoplasm of breast: Secondary | ICD-10-CM

## 2022-08-15 DIAGNOSIS — Z23 Encounter for immunization: Secondary | ICD-10-CM

## 2022-08-15 NOTE — Progress Notes (Signed)
[place]   Patient presents to clinic today for annual exam.  Patient is not fasting for labs.  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 20 capsule 0   etonogestrel (NEXPLANON) 68 MG IMPL implant 1 each by Subdermal route once.     guaiFENesin (MUCINEX) 600 MG 12 hr tablet Take 1 tablet (600 mg total) by mouth 2 (two) times daily. 20 tablet 0   ipratropium (ATROVENT) 0.03 % nasal spray Place 2 sprays into both nostrils every 12 (twelve) hours. 30 mL 0   No current facility-administered medications on file prior to visit.    No Known Allergies  Family History  Problem Relation Age of Onset   Diabetes Brother     Social History   Socioeconomic History   Marital status: Married    Spouse name: Not on file   Number of children: 4   Years of education: Not on file   Highest education level: 9th grade  Occupational History   Occupation: unemployed  Tobacco Use   Smoking status: Never   Smokeless tobacco: Never  Vaping Use   Vaping Use: Never used  Substance and Sexual Activity   Alcohol use: Never   Drug use: Never   Sexual activity: Yes  Other Topics Concern   Not on file  Social History Narrative   Not on file   Social  Determinants of Health   Financial Resource Strain: Not on file  Food Insecurity: Not on file  Transportation Needs: Not on file  Physical Activity: Inactive (02/07/2020)   Exercise Vital Sign    Days of Exercise per Week: 0 days    Minutes of Exercise per Session: 0 min  Stress: Not on file  Social Connections: Not on file  Intimate Partner Violence: Not At Risk (02/07/2020)   Humiliation, Afraid, Rape, and Kick questionnaire    Fear of Current or Ex-Partner: No    Emotionally Abused: No    Physically Abused: No    Sexually Abused: No    ROS Comprehensive ROS Pertinent positive and negative noted in HPI   Blood Pressure 119/81   Pulse 79   Respiration 16   Height 4\' 9"  (1.448 m)   Weight 114 lb 9.6 oz (52 kg)   Last Menstrual Period  (Approximate) Comment: nexplanon  Oxygen Saturation 98%   Body Mass Index 24.80 kg/m    Physical exam: General: Vital signs reviewed.  Patient is well-developed and well-nourished, normal weight in no acute distress and cooperative with exam. Head: Normocephalic and atraumatic. Eyes: EOMI, conjunctivae normal, no scleral icterus. Neck: Supple, trachea midline, normal ROM, no JVD, masses, thyromegaly, or carotid bruit present. Cardiovascular: RRR, S1 normal, S2 normal, no murmurs, gallops, or rubs. Pulmonary/Chest: Clear  to auscultation bilaterally, no wheezes, rales, or rhonchi. Abdominal: Soft, non-tender, non-distended, BS +, no masses, organomegaly, or guarding present. Musculoskeletal: No joint deformities, erythema, or stiffness, ROM full and nontender. Extremities: No lower extremity edema bilaterally,  pulses symmetric and intact bilaterally. No cyanosis or clubbing. Neurological: A&O x3, Strength is normal Skin: Warm, dry and intact. No rashes or erythema. Psychiatric: Normal mood and affect. speech and behavior is normal. Cognition and memory are normal.    Recent Results (from the past 2160 hour(s))  Resp panel by RT-PCR (RSV, Flu  A&B, Covid) Anterior Nasal Swab     Status: Abnormal   Collection Time: 07/11/22  5:38 PM   Specimen: Anterior Nasal Swab  Result Value Ref Range   SARS Coronavirus 2 by RT PCR NEGATIVE NEGATIVE    Comment: (NOTE) SARS-CoV-2 target nucleic acids are NOT DETECTED.  The SARS-CoV-2 RNA is generally detectable in upper respiratory specimens during the acute phase of infection. The lowest concentration of SARS-CoV-2 viral copies this assay can detect is 138 copies/mL. A negative result does not preclude SARS-Cov-2 infection and should not be used as the sole basis for treatment or other patient management decisions. A negative result may occur with  improper specimen collection/handling, submission of specimen other than nasopharyngeal swab, presence of viral mutation(s) within the areas targeted by this assay, and inadequate number of viral copies(<138 copies/mL). A negative result must be combined with clinical observations, patient history, and epidemiological information. The expected result is Negative.  Fact Sheet for Patients:  EntrepreneurPulse.com.au  Fact Sheet for Healthcare Providers:  IncredibleEmployment.be  This test is no t yet approved or cleared by the [place] FDA and  has been authorized for detection and/or diagnosis of SARS-CoV-2 by FDA under an Emergency Use Authorization (EUA). This EUA will remain  in effect (meaning this test can be used) for the duration of the COVID-19 declaration under Section 564(b)(1) of the Act, 21 U.S.C.section 360bbb-3(b)(1), unless the authorization is terminated  or revoked sooner.       Influenza A by PCR NEGATIVE NEGATIVE   Influenza B by PCR POSITIVE (A) NEGATIVE    Comment: (NOTE) The Xpert Xpress SARS-CoV-2/FLU/RSV plus assay is intended as an aid in the diagnosis of influenza from Nasopharyngeal swab specimens and should not be used as a sole basis for treatment. Nasal washings  and aspirates are unacceptable for Xpert Xpress SARS-CoV-2/FLU/RSV testing.  Fact Sheet for Patients: EntrepreneurPulse.com.au  Fact Sheet for Healthcare Providers: IncredibleEmployment.be  This test is not yet approved or cleared by the [place] FDA and has been authorized for detection and/or diagnosis of SARS-CoV-2 by FDA under an Emergency Use Authorization (EUA). This EUA will remain in effect (meaning this test can be used) for the duration of the COVID-19 declaration under Section 564(b)(1) of the Act, 21 U.S.C. section 360bbb-3(b)(1), unless the authorization is terminated or revoked.     Resp Syncytial Virus by PCR NEGATIVE NEGATIVE    Comment: (NOTE) Fact Sheet for Patients: EntrepreneurPulse.com.au  Fact Sheet for Healthcare Providers: IncredibleEmployment.be  This test is not yet approved or cleared by the [place] FDA and has been authorized for detection and/or diagnosis of SARS-CoV-2 by FDA under an Emergency Use Authorization (EUA). This EUA will remain in effect (meaning this test can be used) for the duration of the COVID-19 declaration under Section 564(b)(1) of the Act, 21 U.S.C. section 360bbb-3(b)(1), unless the authorization is terminated or revoked.  Performed at [facility], [place] [address],  [place], [place] [address]     Assessment/Plan: Claudia Patterson was seen today for annual exam.  Diagnoses and all orders for this visit:  Need for immunization against influenza -     Flu Vaccine QUAD 12mo+IM (Fluarix, Fluzone & Alfiuria Quad PF)  Encounter for screening mammogram for malignant neoplasm of breast Referral for mammogram   Amenorrhea Birth control previously had symptoms of nausea     This note has been created with Surveyor, quantity. Any transcriptional errors are unintentional.   08/15/2022, 4:26 PM  Kerin Perna, NP

## 2022-08-16 LAB — HCG, SERUM, QUALITATIVE: hCG,Beta Subunit,Qual,Serum: NEGATIVE m[IU]/mL (ref <6)

## 2022-08-19 LAB — CERVICOVAGINAL ANCILLARY ONLY
Bacterial Vaginitis (gardnerella): POSITIVE — AB
Candida Glabrata: NEGATIVE
Candida Vaginitis: POSITIVE — AB
Chlamydia: NEGATIVE
Comment: NEGATIVE
Comment: NEGATIVE
Comment: NEGATIVE
Comment: NEGATIVE
Comment: NEGATIVE
Comment: NORMAL
Neisseria Gonorrhea: NEGATIVE
Trichomonas: NEGATIVE

## 2022-08-20 ENCOUNTER — Other Ambulatory Visit (INDEPENDENT_AMBULATORY_CARE_PROVIDER_SITE_OTHER): Payer: Self-pay | Admitting: Primary Care

## 2022-08-20 DIAGNOSIS — B9689 Other specified bacterial agents as the cause of diseases classified elsewhere: Secondary | ICD-10-CM

## 2022-08-20 DIAGNOSIS — B379 Candidiasis, unspecified: Secondary | ICD-10-CM

## 2022-08-20 MED ORDER — FLUCONAZOLE 150 MG PO TABS: 150.0000 mg | ORAL_TABLET | Freq: Every day | ORAL | 1 refills | Status: DC

## 2022-08-20 MED ORDER — METRONIDAZOLE 500 MG PO TABS: 500.0000 mg | ORAL_TABLET | Freq: Two times a day (BID) | ORAL | 0 refills | Status: DC

## 2023-08-21 ENCOUNTER — Ambulatory Visit (INDEPENDENT_AMBULATORY_CARE_PROVIDER_SITE_OTHER): Payer: Managed Care, Other (non HMO) | Admitting: Primary Care

## 2023-08-21 ENCOUNTER — Encounter (INDEPENDENT_AMBULATORY_CARE_PROVIDER_SITE_OTHER): Payer: Self-pay | Admitting: Primary Care

## 2023-08-21 ENCOUNTER — Other Ambulatory Visit (HOSPITAL_COMMUNITY)
Admission: RE | Admit: 2023-08-21 | Discharge: 2023-08-21 | Disposition: A | Discharge status: Home / Self Care | Payer: Managed Care, Other (non HMO) | Source: Ambulatory Visit | Attending: Primary Care | Admitting: Primary Care

## 2023-08-21 VITALS — BP 153/86 | HR 98 | Resp 16 | Ht <= 58 in | Wt 118.4 lb

## 2023-08-21 DIAGNOSIS — Z124 Encounter for screening for malignant neoplasm of cervix: Secondary | ICD-10-CM

## 2023-08-21 DIAGNOSIS — Z113 Encounter for screening for infections with a predominantly sexual mode of transmission: Secondary | ICD-10-CM | POA: Insufficient documentation

## 2023-08-21 DIAGNOSIS — N760 Acute vaginitis: Secondary | ICD-10-CM | POA: Diagnosis not present

## 2023-08-21 DIAGNOSIS — B9689 Other specified bacterial agents as the cause of diseases classified elsewhere: Secondary | ICD-10-CM | POA: Insufficient documentation

## 2023-08-21 DIAGNOSIS — R300 Dysuria: Secondary | ICD-10-CM | POA: Diagnosis not present

## 2023-08-21 DIAGNOSIS — B3731 Acute candidiasis of vulva and vagina: Secondary | ICD-10-CM | POA: Insufficient documentation

## 2023-08-21 LAB — POCT URINALYSIS DIP (CLINITEK)
Bilirubin, UA: NEGATIVE
Glucose, UA: NEGATIVE mg/dL
Ketones, POC UA: NEGATIVE mg/dL
Nitrite, UA: NEGATIVE
POC PROTEIN,UA: NEGATIVE
Spec Grav, UA: >=1.03 — AB (ref 1.010–1.025)
Urobilinogen, UA: 1 U/dL
pH, UA: 6.5 (ref 5.0–8.0)

## 2023-08-21 NOTE — Progress Notes (Signed)
  [facility]  WELL-WOMAN PHYSICAL & PAP Patient name: Claudia Patterson MRN [id number]  Date of birth: 1981-06-27 Chief Complaint:   Gynecologic Exam  History of Present Illness:   Claudia Patterson is a 43 y.o. [id number] female being seen today for a routine well-woman exam.   CC:gyn  The current method of family planning is Nexplanon.  No LMP recorded. (Menstrual status: Irregular Periods). . Family h/o breast cancer: No . Family h/o colorectal cancer: No  Health Maintenance  Topic Date Due   DTaP/Tdap/Td vaccine (1 - Tdap) Never done   Pap with HPV screening  10/18/2022   Flu Shot  02/13/2023   COVID-19 Vaccine (1 - 2024-25 season) Never done   Hepatitis C Screening  Completed   HIV Screening  Completed   HPV Vaccine  Aged Out   Review of Systems:    Denies any headaches, blurred vision, fatigue, shortness of breath, chest pain, abdominal pain, abnormal vaginal discharge/itching/odor/irritation, problems with periods, bowel movements, urination, or intercourse unless otherwise stated above.  Pertinent History Reviewed:   Reviewed past medical,surgical, social and family history.  Reviewed problem list, medications and allergies.  Physical Assessment:   Vitals:   08/21/23 1524 08/21/23 1525  BP: (!) 159/104 (!) 153/86  Pulse: 98   Resp: 16   SpO2: 100%   Weight: 118 lb 6.4 oz (53.7 kg)   Height: 4' 9 (1.448 m)   Body mass index is 25.62 kg/m.        Physical Examination:  General appearance - well appearing, and in no distress Mental status - alert, oriented to person, place, and time Psych:  She has a normal mood and affect Skin - warm and dry, normal color, no suspicious lesions noted Chest - effort normal, all lung fields clear to auscultation bilaterally Heart - normal rate and regular rhythm Neck:  midline trachea, no thyromegaly or nodules Breasts - breasts appear normal, no suspicious masses, no skin or nipple changes or axillary  nodes Educated patient on proper self breast examination and had patient to demonstrate SBE. Abdomen - soft, nontender, nondistended, no masses or organomegaly Pelvic-VULVA: normal appearing vulva with no masses, tenderness or lesions   VAGINA: normal appearing vagina with normal color and discharge, no lesions   CERVIX: normal appearing cervix without discharge or lesions, no CMT UTERUS: uterus is felt to be normal size, shape, consistency and nontender  ADNEXA: No adnexal masses or tenderness noted. Extremities:  No swelling or varicosities noted  No results found for this or any previous visit (from the past 24 hours).   Assessment & Plan:  Claudia Patterson was seen today for gynecologic exam.  Diagnoses and all orders for this visit:  Cervical cancer screening -     Cytology - PAP  Screening for STD (sexually transmitted disease) -     Cervicovaginal ancillary only  Dysuria -     POCT URINALYSIS DIP (CLINITEK) -     Urine Culture    Orders Placed This Encounter  Procedures   POCT URINALYSIS DIP (CLINITEK)     This note has been created with Education officer, environmental. Any transcriptional errors are unintentional.   Rosaline SHAUNNA Bohr, NP 08/21/2023, 3:48 PM

## 2023-08-22 ENCOUNTER — Telehealth (INDEPENDENT_AMBULATORY_CARE_PROVIDER_SITE_OTHER): Payer: Self-pay | Admitting: Primary Care

## 2023-08-22 NOTE — Telephone Encounter (Signed)
 Spoke with pt daughter who states pt was seen in office yesterday. Pt was told metronidazole  and fluconazole  would be sent to pharmacy but no meds were sent.

## 2023-08-22 NOTE — Telephone Encounter (Signed)
 On call provider called with no answer, VM left with call back request.

## 2023-08-22 NOTE — Telephone Encounter (Signed)
 On call Dr. Elvan Hamel spoken with and made aware. She states she does not know pt and not sure why it wasn't sent in. Will route high priority to office.

## 2023-08-22 NOTE — Telephone Encounter (Signed)
 Copied from CRM [id number]. Topic: Clinical - Medication Refill >> Aug 22, 2023  4:58 PM Wess RAMAN wrote: Most Recent Primary Care Visit:  Provider: CELESTIA ROSALINE SQUIBB  Department: [facility]  Visit Type: OFFICE VISIT  Date: 08/21/2023  Medication: Metronidazole  and Fluconazole   Has the patient contacted their pharmacy? Yes (Agent: If no, request that the patient contact the pharmacy for the refill. If patient does not wish to contact the pharmacy document the reason why and proceed with request.) (Agent: If yes, when and what did the pharmacy advise?)  Contacted the pharmacy on 08/22/23 and they stated there was no prescription  Is this the correct pharmacy for this prescription? Yes If no, delete pharmacy and type the correct one.  This is the patient's preferred pharmacy:   [id number] - [place], [place] - [address] [facility] [facility] [address] [place] [address] Phone: [phone] Fax: [phone]   Has the prescription been filled recently? No  Is the patient out of the medication? Yes  Has the patient been seen for an appointment in the last year OR does the patient have an upcoming appointment? Yes  Can we respond through MyChart? Yes  Agent: Please be advised that Rx refills may take up to 3 business days. We ask that you follow-up with your pharmacy.

## 2023-08-22 NOTE — Telephone Encounter (Signed)
 First attempt to reach patient. Left vm requesting call back to office. No interpreters currently available for pt language. Routing to call back folder.

## 2023-08-23 LAB — URINE CULTURE

## 2023-08-25 LAB — CERVICOVAGINAL ANCILLARY ONLY
Bacterial Vaginitis (gardnerella): POSITIVE — AB
Candida Glabrata: NEGATIVE
Candida Vaginitis: POSITIVE — AB
Chlamydia: NEGATIVE
Comment: NEGATIVE
Comment: NEGATIVE
Comment: NEGATIVE
Comment: NEGATIVE
Comment: NEGATIVE
Comment: NORMAL
Neisseria Gonorrhea: NEGATIVE
Trichomonas: NEGATIVE

## 2023-08-27 ENCOUNTER — Other Ambulatory Visit (INDEPENDENT_AMBULATORY_CARE_PROVIDER_SITE_OTHER): Payer: Self-pay | Admitting: Primary Care

## 2023-08-27 ENCOUNTER — Encounter (INDEPENDENT_AMBULATORY_CARE_PROVIDER_SITE_OTHER): Payer: Self-pay | Admitting: Primary Care

## 2023-08-27 LAB — CYTOLOGY - PAP
Comment: NEGATIVE
Diagnosis: NEGATIVE
High risk HPV: NEGATIVE

## 2023-08-27 MED ORDER — FLUCONAZOLE 150 MG PO TABS: 150.0000 mg | ORAL_TABLET | Freq: Every day | ORAL | 1 refills | Status: DC

## 2023-08-27 MED ORDER — METRONIDAZOLE 500 MG PO TABS: 500.0000 mg | ORAL_TABLET | Freq: Two times a day (BID) | ORAL | 0 refills | Status: DC

## 2024-03-05 ENCOUNTER — Encounter (HOSPITAL_COMMUNITY): Payer: Self-pay | Admitting: Emergency Medicine

## 2024-03-05 ENCOUNTER — Emergency Department (HOSPITAL_COMMUNITY)
Admission: EM | Admit: 2024-03-05 | Discharge: 2024-03-05 | Disposition: A | Discharge status: Home / Self Care | Attending: Emergency Medicine | Admitting: Emergency Medicine

## 2024-03-05 ENCOUNTER — Other Ambulatory Visit: Payer: Self-pay

## 2024-03-05 DIAGNOSIS — N898 Other specified noninflammatory disorders of vagina: Secondary | ICD-10-CM | POA: Diagnosis not present

## 2024-03-05 DIAGNOSIS — M545 Low back pain, unspecified: Secondary | ICD-10-CM | POA: Diagnosis present

## 2024-03-05 LAB — URINALYSIS, ROUTINE W REFLEX MICROSCOPIC
Bilirubin Urine: NEGATIVE
Glucose, UA: NEGATIVE mg/dL
Hgb urine dipstick: NEGATIVE
Ketones, ur: NEGATIVE mg/dL
Nitrite: NEGATIVE
Protein, ur: NEGATIVE mg/dL
Specific Gravity, Urine: 1.026 (ref 1.005–1.030)
pH: 5 (ref 5.0–8.0)

## 2024-03-05 LAB — WET PREP, GENITAL
Clue Cells Wet Prep HPF POC: NONE SEEN
Sperm: NONE SEEN
Trich, Wet Prep: NONE SEEN
WBC, Wet Prep HPF POC: <10 (ref <10)
Yeast Wet Prep HPF POC: NONE SEEN

## 2024-03-05 LAB — POC URINE PREG, ED: Preg Test, Ur: NEGATIVE

## 2024-03-05 MED ORDER — METHOCARBAMOL 500 MG PO TABS: 500.0000 mg | ORAL_TABLET | Freq: Two times a day (BID) | ORAL | 0 refills | Status: DC

## 2024-03-05 MED ORDER — METHOCARBAMOL 500 MG PO TABS
500.0000 mg | ORAL_TABLET | Freq: Once | ORAL | Status: AC
  Administered 2024-03-05: 500 mg via ORAL
  Filled 2024-03-05: qty 1

## 2024-03-05 MED ORDER — NAPROXEN 500 MG PO TABS: 500.0000 mg | ORAL_TABLET | Freq: Two times a day (BID) | ORAL | 0 refills | Status: DC

## 2024-03-05 MED ORDER — DOXYCYCLINE HYCLATE 100 MG PO TABS: 100.0000 mg | ORAL_TABLET | Freq: Two times a day (BID) | ORAL | 0 refills | Status: DC

## 2024-03-05 MED ORDER — LIDOCAINE HCL (PF) 1 % IJ SOLN
1.0000 mL | Freq: Once | INTRAMUSCULAR | Status: AC
  Administered 2024-03-05: 1 mL
  Filled 2024-03-05: qty 5

## 2024-03-05 MED ORDER — CEFTRIAXONE SODIUM 500 MG IJ SOLR
500.0000 mg | Freq: Once | INTRAMUSCULAR | Status: AC
  Administered 2024-03-05: 500 mg via INTRAMUSCULAR
  Filled 2024-03-05: qty 500

## 2024-03-05 MED ORDER — LIDOCAINE 5 % EX PTCH
1.0000 | MEDICATED_PATCH | CUTANEOUS | Status: DC
  Administered 2024-03-05: 1 via TRANSDERMAL
  Filled 2024-03-05: qty 1

## 2024-03-05 NOTE — ED Triage Notes (Signed)
 Pt came in for 5/10 back pain and vaginal discomfort/itching with hx of treated vaginal infection. No discharge. No pain with urination. Back pain is worse with movement.

## 2024-03-05 NOTE — Discharge Instructions (Addendum)
 It was a pleasure taking care of you today.  As discussed, your workup was reassuring.  I am sending you home with pain medication for your low back.  Take as needed for pain.  Muscle relaxer can cause drowsiness so do not drive or operate machinery while on the medication.  I am also sending you home with an antibiotic.  You were treated for gonorrhea and chlamydia.  Take antibiotic as prescribed and finish all antibiotics.  Use protection with intercourse.  Follow-up with PCP if symptoms not improved.  Return to the ER for any worsening symptoms

## 2024-03-05 NOTE — ED Provider Notes (Signed)
 [place] [facility] AT [facility] Provider Note   CSN: [id number] Arrival date & time: 03/05/24  1122     Patient presents with: No chief complaint on file.   Claudia Patterson is a 43 y.o. female with no significant past medical history who presents to the ED due to 2 complaints of low back pain and vaginal discharge.  States back pain has been present for the past few days.  No direct injury.  Denies saddle anesthesia, bowel/bladder incontinence, lower extremity numbness/tingling, lower extremity weakness.  No fever or chills.  No IV drug use.  Denies abdominal pain.   Patient also admits to vaginal discharge and odor.  Recently treated for an infection.  Unclear which infection this was.  Patient is unaware of what medication she was placed on.  Notes it helped for a little however, returned.  Husband with similar symptoms.  Denies concern for STIs?  Nuys abdominal pain.  No pelvic pain.  History obtained from patient and past medical records. Official [place] interpreter used during entire encounter      Prior to Admission medications   Medication Sig Start Date End Date Taking? Authorizing Provider  doxycycline  (VIBRA -TABS) 100 MG tablet Take 1 tablet (100 mg total) by mouth 2 (two) times daily. 03/05/24  Yes Mileena Rothenberger, Aleck BROCKS, PA-C  methocarbamol  (ROBAXIN ) 500 MG tablet Take 1 tablet (500 mg total) by mouth 2 (two) times daily. 03/05/24  Yes Travian Kerner, Aleck BROCKS, PA-C  naproxen  (NAPROSYN ) 500 MG tablet Take 1 tablet (500 mg total) by mouth 2 (two) times daily. 03/05/24  Yes Markita Stcharles, Aleck BROCKS, PA-C  etonogestrel (NEXPLANON) 68 MG IMPL implant 1 each by Subdermal route once.    [provider]  fluconazole  (DIFLUCAN ) 150 MG tablet Take 1 tablet (150 mg total) by mouth daily. 08/27/23   Celestia Rosaline SQUIBB, NP  metroNIDAZOLE  (FLAGYL ) 500 MG tablet Take 1 tablet (500 mg total) by mouth 2 (two) times daily. 08/27/23   Celestia Rosaline SQUIBB, NP     Allergies: Patient has no allergy information on record.    Review of Systems  Constitutional:  Negative for fever.  Respiratory:  Negative for shortness of breath.   Cardiovascular:  Negative for chest pain.  Gastrointestinal:  Negative for abdominal pain.  Genitourinary:  Positive for vaginal discharge. Negative for dysuria.  Musculoskeletal:  Positive for back pain.    Updated Vital Signs BP (!) 146/90   Pulse 99   Temp (!) 97.3 F (36.3 C) (Temporal)   Resp 17   SpO2 100%   Physical Exam Vitals and nursing note reviewed.  Constitutional:      General: She is not in acute distress.    Appearance: She is not ill-appearing.  HENT:     Head: Normocephalic.  Eyes:     Pupils: Pupils are equal, round, and reactive to light.  Cardiovascular:     Rate and Rhythm: Normal rate and regular rhythm.     Pulses: Normal pulses.     Heart sounds: Normal heart sounds. No murmur heard.    No friction rub. No gallop.  Pulmonary:     Effort: Pulmonary effort is normal.     Breath sounds: Normal breath sounds.  Abdominal:     General: Abdomen is flat. There is no distension.     Palpations: Abdomen is soft.     Tenderness: There is no abdominal tenderness. There is no guarding or rebound.     Comments: Abdomen soft, nondistended, nontender  to palpation in all quadrants without guarding or peritoneal signs. No rebound.   Genitourinary:    Comments: Self swabbed Musculoskeletal:        General: Normal range of motion.     Cervical back: Neck supple.     Comments: No thoracic or lumbar midline tenderness. Lower extremities neurovascularly intact.   Skin:    General: Skin is warm and dry.  Neurological:     General: No focal deficit present.     Mental Status: She is alert.  Psychiatric:        Mood and Affect: Mood normal.        Behavior: Behavior normal.     (all labs ordered are listed, but only abnormal results are displayed) Labs Reviewed  URINALYSIS, ROUTINE W  REFLEX MICROSCOPIC - Abnormal; Notable for the following components:      Result Value   APPearance HAZY (*)    Leukocytes,Ua MODERATE (*)    Bacteria, UA RARE (*)    All other components within normal limits  WET PREP, GENITAL  POC URINE PREG, ED  GC/CHLAMYDIA PROBE AMP ([place]) NOT AT [facility]    EKG: None  Radiology: No results found.   Procedures   Medications Ordered in the ED  lidocaine  (LIDODERM ) 5 % 1 patch (1 patch Transdermal Patch Applied 03/05/24 1338)  cefTRIAXone  (ROCEPHIN ) injection 500 mg (has no administration in time range)  lidocaine  (PF) (XYLOCAINE ) 1 % injection 1-2.1 mL (has no administration in time range)  methocarbamol  (ROBAXIN ) tablet 500 mg (500 mg Oral Given 03/05/24 1338)    Clinical Course as of 03/05/24 1412  Fri Mar 05, 2024  1329 Preg Test, Ur: NEGATIVE [CA]  1347 Squamous Epithelial / HPF: 6-10 [CA]  1347 Bacteria, UA(!): RARE [CA]  [address]): MODERATE [CA]    Clinical Course User Index [CA] [name], PA-C                                 Medical Decision Making Amount and/or Complexity of Data Reviewed Labs: ordered. Decision-making details documented in ED Course.  Risk Prescription drug management.   43 year old female presents to the ED due to low back pain and vaginal discharge.  Husband with genital itching.  Denies concern for STIs.  No fever or chills.  Denies saddle anesthesia, bowel/bladder incontinence, lower extremity numbness/tingling, lower extremity weakness.  Upon arrival, stable vitals.  Patient no acute distress.  Abdomen soft, nondistended, nontender.  Low suspicion for ovarian torsion or PID.  No thoracic or lumbar midline tenderness.  Lower extremities neurovascularly intact.  Low suspicion for cauda equina or central cord compression.  UA, wet prep, pregnancy test ordered.  Wet prep negative.  Pregnancy test negative.  Low suspicion for ectopic pregnancy.  UA with moderate leukocytes and rare  bacteria.  Does appear contaminated.  Patient denies any dysuria.  Low suspicion for acute cystitis.  Gonorrhea/chlamydia pending.  Will go ahead and treat with Rocephin  and doxycycline .  Patient agreeable.  In regards to low back pain suspect symptoms likely related to muscular etiology.  Patient had improvement after Robaxin  and Lidoderm  patch.  Will discharge with symptomatic treatment.  Patient stable for discharge. Strict ED precautions discussed with patient. Patient states understanding and agrees to plan. Patient discharged home in no acute distress and stable vitals  Language barrier- translator used    Final diagnoses:  Acute bilateral low back pain without sciatica  Vaginal discharge    ED Discharge Orders          Ordered    doxycycline  (VIBRA -TABS) 100 MG tablet  2 times daily        03/05/24 1403    naproxen  (NAPROSYN ) 500 MG tablet  2 times daily        03/05/24 1411    methocarbamol  (ROBAXIN ) 500 MG tablet  2 times daily        03/05/24 1411               Narely Nobles C, PA-C 03/05/24 1433    Emil Share, DO 03/06/24 0700

## 2024-03-08 LAB — GC/CHLAMYDIA PROBE AMP (~~LOC~~) NOT AT ARMC
Chlamydia: NEGATIVE
Comment: NEGATIVE
Comment: NORMAL
Neisseria Gonorrhea: NEGATIVE

## 2024-06-01 ENCOUNTER — Encounter (INDEPENDENT_AMBULATORY_CARE_PROVIDER_SITE_OTHER): Payer: Self-pay | Admitting: Primary Care

## 2024-06-01 ENCOUNTER — Ambulatory Visit (INDEPENDENT_AMBULATORY_CARE_PROVIDER_SITE_OTHER): Admitting: Primary Care

## 2024-06-01 VITALS — BP 128/68 | HR 64 | Resp 16 | Wt 122.0 lb

## 2024-06-01 DIAGNOSIS — R030 Elevated blood-pressure reading, without diagnosis of hypertension: Secondary | ICD-10-CM

## 2024-06-01 DIAGNOSIS — N898 Other specified noninflammatory disorders of vagina: Secondary | ICD-10-CM | POA: Diagnosis not present

## 2024-06-01 DIAGNOSIS — Z1231 Encounter for screening mammogram for malignant neoplasm of breast: Secondary | ICD-10-CM

## 2024-06-01 DIAGNOSIS — Z603 Acculturation difficulty: Secondary | ICD-10-CM | POA: Diagnosis not present

## 2024-06-01 DIAGNOSIS — G4700 Insomnia, unspecified: Secondary | ICD-10-CM

## 2024-06-01 MED ORDER — TRAZODONE HCL 50 MG PO TABS: 25.0000 mg | ORAL_TABLET | Freq: Every evening | ORAL | Status: AC | PRN

## 2024-06-01 NOTE — Progress Notes (Signed)
 [facility]  Claudia Patterson, is a 43 y.o. female  RDW:[id number]  FMW:[id number]  DOB - 1980/09/08  Chief Complaint  Patient presents with   Vaginal Discharge    Claudia Patterson discharge/white discharge   Insomnia       Subjective:   Claudia Patterson is a 43 y.o. [place] female here today for an acute visit. She initially came in for vaginal discharge brown and white however she started her menstrual cycle today and results would not be accurate. Patient has given her husband permission to be at her appointment and also interpret for her Claudia Patterson She was treated and completed initial medication concerned that itching and discharge were still present. Patient also has problems sleeping do you snore- no.  She has problems going to sleep staying asleep and waking up several times during the night. Blood pressure is elevated reviewed readings has been she denies shortness of breath, chest pain, headaches, and dizzy spells. Vaginal Discharge The patient's primary symptoms include vaginal discharge.  Insomnia   No problems updated.  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 14 tablet 0   etonogestrel (NEXPLANON) 68 MG IMPL implant 1 each by Subdermal route once.     fluconazole  (DIFLUCAN ) 150 MG tablet Take 1 tablet (150 mg total) by mouth daily. 1 tablet 1   methocarbamol  (ROBAXIN ) 500 MG tablet Take 1 tablet (500 mg total) by mouth 2 (two) times daily. 20 tablet 0   metroNIDAZOLE  (FLAGYL )  500 MG tablet Take 1 tablet (500 mg total) by mouth 2 (two) times daily. 14 tablet 0   naproxen  (NAPROSYN ) 500 MG tablet Take 1 tablet (500 mg total) by mouth 2 (two) times daily. 30 tablet 0   No current facility-administered medications on file prior to visit.   Health Maintenance  Topic Date Due   DTaP/Tdap/Td vaccine (1 - Tdap) Never done   Hepatitis B Vaccine (1 of 3 - 19+ 3-dose series) Never done   HPV Vaccine (1 - 3-dose SCDM series) Never done   Breast Cancer Screening  Never done   Flu Shot  02/13/2024   COVID-19 Vaccine (1 - 2025-26 season) Never done   Pap with HPV screening  08/20/2028   Hepatitis C Screening  Completed   HIV Screening  Completed   Pneumococcal Vaccine  Aged Out   Meningitis B Vaccine  Aged Out    Objective:   Vitals:   06/01/24 1404 06/01/24 1409 06/01/24 1428  BP: (!) 166/97 (!) 151/88 128/68  Pulse: 64    Resp: 16    SpO2: 100%    Weight: 122 lb (55.3 kg)     BP Readings from Last 3 Encounters:  06/01/24 128/68  03/05/24 112/86  08/21/23 (!) 153/86      Physical Exam Vitals reviewed.  Constitutional:      Appearance: Normal appearance. She is normal weight.  HENT:     Head: Normocephalic.     Right Ear: Tympanic membrane, ear canal and external  ear normal.     Left Ear: Tympanic membrane, ear canal and external ear normal.     Nose: Nose normal.     Mouth/Throat:     Mouth: Mucous membranes are moist.  Eyes:     Extraocular Movements: Extraocular movements intact.     Pupils: Pupils are equal, round, and reactive to light.  Cardiovascular:     Rate and Rhythm: Normal rate.  Pulmonary:     Effort: Pulmonary effort is normal.     Breath sounds: Normal breath sounds.  Abdominal:     General: Bowel sounds are normal.     Palpations: Abdomen is soft.  Musculoskeletal:        General: Normal range of motion.     Cervical back: Normal range of motion.  Skin:    General: Skin is warm and dry.  Neurological:     Mental Status:  She is alert and oriented to person, place, and time.  Psychiatric:        Mood and Affect: Mood normal.        Behavior: Behavior normal.        Thought Content: Thought content normal.      Assessment & Plan  Caelin was seen today for vaginal discharge and insomnia.  Diagnoses and all orders for this visit:  Vaginal discharge On menstrual cycle -     Cervicovaginal ancillary only; Future  Language barrier, cultural differences  Insomnia, unspecified type -     traZODone (DESYREL) 50 MG tablet; Take 0.5-1 tablets (25-50 mg total) by mouth at bedtime as needed for sleep.  Elevated blood pressure reading in office without diagnosis of hypertension Reck normal   Encounter for screening mammogram for malignant neoplasm of breast -     MM 3D SCREENING MAMMOGRAM BILATERAL BREAST; Future    . Patient have been counseled extensively about nutrition and exercise. Other issues discussed during this visit include: low cholesterol diet, weight control and daily exercise, foot care, annual eye examinations at Ophthalmology, importance of adherence with medications and regular follow-up. We also discussed long term complications of uncontrolled diabetes and hypertension.   Return in about 3 weeks (around 06/22/2024) for re-check blood pressure.  The patient was given clear instructions to go to ER or return to medical center if symptoms don't improve, worsen or new problems develop. The patient verbalized understanding. The patient was told to call to get lab results if they haven't heard anything in the next week.   This note has been created with Education officer, environmental. Any transcriptional errors are unintentional.   Claudia SHAUNNA Bohr, NP 06/01/2024, 2:33 PM

## 2024-06-22 ENCOUNTER — Ambulatory Visit (INDEPENDENT_AMBULATORY_CARE_PROVIDER_SITE_OTHER)

## 2024-06-22 ENCOUNTER — Other Ambulatory Visit (HOSPITAL_COMMUNITY)
Admission: RE | Admit: 2024-06-22 | Discharge: 2024-06-22 | Disposition: A | Discharge status: Home / Self Care | Source: Ambulatory Visit | Attending: Primary Care | Admitting: Primary Care

## 2024-06-22 ENCOUNTER — Other Ambulatory Visit (INDEPENDENT_AMBULATORY_CARE_PROVIDER_SITE_OTHER): Payer: Self-pay

## 2024-06-22 VITALS — BP 122/80

## 2024-06-22 DIAGNOSIS — Z0130 Encounter for examination of blood pressure without abnormal findings: Secondary | ICD-10-CM

## 2024-06-22 DIAGNOSIS — N898 Other specified noninflammatory disorders of vagina: Secondary | ICD-10-CM

## 2024-06-22 NOTE — Progress Notes (Signed)
   Blood Pressure Recheck Visit  Name: Lataya Varnell MRN: [id number] Date of Birth: [birth year]  Rhunette Oris Brunette presents today for Blood Pressure recheck with clinical support staff.  Order for BP recheck by Rosaline Bohr, ordered on 06/01/24.   BP Readings from Last 3 Encounters:  06/22/24 122/80  06/01/24 128/68  03/05/24 112/86    Current Outpatient Medications  Medication Sig Dispense Refill   etonogestrel (NEXPLANON) 68 MG IMPL implant 1 each by Subdermal route once.     traZODone  (DESYREL ) 50 MG tablet Take 0.5-1 tablets (25-50 mg total) by mouth at bedtime as needed for sleep. 30 tablet )   No current facility-administered medications for this visit.    Hypertensive Medication Review: Patient currently doesn't take any medication for hypertension  Documentation of any medication adherence discrepancies: none  Provider Recommendation:  Spoke to [place] and she stated bp is better.    Patient has been given provider's recommendations and does not have any questions or concerns at this time. Patient will contact the office for any future questions or concerns.    Pt also had to give a swab from previosuly visit

## 2024-06-23 LAB — CERVICOVAGINAL ANCILLARY ONLY
Bacterial Vaginitis (gardnerella): POSITIVE — AB
Candida Glabrata: NEGATIVE
Candida Vaginitis: POSITIVE — AB
Chlamydia: NEGATIVE
Comment: NEGATIVE
Comment: NEGATIVE
Comment: NEGATIVE
Comment: NEGATIVE
Comment: NEGATIVE
Comment: NORMAL
Neisseria Gonorrhea: NEGATIVE
Trichomonas: NEGATIVE

## 2024-06-28 ENCOUNTER — Ambulatory Visit (INDEPENDENT_AMBULATORY_CARE_PROVIDER_SITE_OTHER): Payer: Self-pay | Admitting: Primary Care

## 2024-06-28 DIAGNOSIS — B9689 Other specified bacterial agents as the cause of diseases classified elsewhere: Secondary | ICD-10-CM

## 2024-06-28 MED ORDER — METRONIDAZOLE 500 MG PO TABS: 500.0000 mg | ORAL_TABLET | Freq: Two times a day (BID) | ORAL | 0 refills | Status: DC

## 2024-06-28 MED ORDER — FLUCONAZOLE 150 MG PO TABS: 150.0000 mg | ORAL_TABLET | Freq: Every day | ORAL | 1 refills | Status: DC

## 2024-06-29 ENCOUNTER — Telehealth (INDEPENDENT_AMBULATORY_CARE_PROVIDER_SITE_OTHER): Payer: Self-pay | Admitting: Primary Care

## 2024-06-29 NOTE — Telephone Encounter (Signed)
 Copied from CRM #[id number]. Topic: Clinical - Prescription Issue >> Jun 29, 2024  1:40 PM Ivette P wrote: Reason for CRM: PT called in regarding medication.   fluconazole  (DIFLUCAN ) 150 MG tablet metroNIDAZOLE  (FLAGYL ) 500 MG tablet  [facility] is stating medication is not available. Pls follow up with pt.

## 2024-06-30 ENCOUNTER — Other Ambulatory Visit (INDEPENDENT_AMBULATORY_CARE_PROVIDER_SITE_OTHER): Payer: Self-pay

## 2024-06-30 ENCOUNTER — Inpatient Hospital Stay: Admission: RE | Admit: 2024-06-30 | Discharge: 2024-06-30 | Attending: Primary Care | Admitting: Primary Care

## 2024-06-30 DIAGNOSIS — B9689 Other specified bacterial agents as the cause of diseases classified elsewhere: Secondary | ICD-10-CM

## 2024-06-30 DIAGNOSIS — Z1231 Encounter for screening mammogram for malignant neoplasm of breast: Secondary | ICD-10-CM

## 2024-06-30 MED ORDER — FLUCONAZOLE 150 MG PO TABS: 150.0000 mg | ORAL_TABLET | Freq: Every day | ORAL | 1 refills | Status: AC

## 2024-06-30 MED ORDER — METRONIDAZOLE 500 MG PO TABS: 500.0000 mg | ORAL_TABLET | Freq: Two times a day (BID) | ORAL | 0 refills | Status: AC

## 2024-06-30 NOTE — Telephone Encounter (Signed)
 Looks like rx's were printed. Called pharmacy and spoke with Curtistine BRAVO. the pharmacist and gave verbal orders for medications.   Please reach out to pt and and make aware that she can pick up medications

## 2024-07-05 ENCOUNTER — Other Ambulatory Visit: Payer: Self-pay | Admitting: Primary Care

## 2024-07-05 DIAGNOSIS — R928 Other abnormal and inconclusive findings on diagnostic imaging of breast: Secondary | ICD-10-CM

## 2024-07-26 ENCOUNTER — Ambulatory Visit
Admission: RE | Admit: 2024-07-26 | Discharge: 2024-07-26 | Disposition: A | Source: Ambulatory Visit | Attending: Primary Care | Admitting: Primary Care

## 2024-07-26 DIAGNOSIS — R928 Other abnormal and inconclusive findings on diagnostic imaging of breast: Secondary | ICD-10-CM
